# Patient Record
Sex: Female | Born: 1980 | Race: Black or African American | Hispanic: No | Marital: Single | Smoking: Never smoker
Health system: Southern US, Community
[De-identification: ages and names within clinical notes are randomized; demographics above are authoritative.]

## PROBLEM LIST (undated history)

## (undated) DIAGNOSIS — G43909 Migraine, unspecified, not intractable, without status migrainosus: Secondary | ICD-10-CM

## (undated) DIAGNOSIS — J069 Acute upper respiratory infection, unspecified: Secondary | ICD-10-CM

## (undated) DIAGNOSIS — J45909 Unspecified asthma, uncomplicated: Secondary | ICD-10-CM

## (undated) DIAGNOSIS — L509 Urticaria, unspecified: Secondary | ICD-10-CM

## (undated) DIAGNOSIS — L309 Dermatitis, unspecified: Secondary | ICD-10-CM

## (undated) HISTORY — DX: Acute upper respiratory infection, unspecified: J06.9

## (undated) HISTORY — DX: Migraine, unspecified, not intractable, without status migrainosus: G43.909

## (undated) HISTORY — DX: Urticaria, unspecified: L50.9

## (undated) HISTORY — PX: ADENOIDECTOMY: SUR15

## (undated) HISTORY — DX: Dermatitis, unspecified: L30.9

## (undated) HISTORY — PX: TONSILLECTOMY: SUR1361

## (undated) HISTORY — DX: Unspecified asthma, uncomplicated: J45.909

---

## 2010-07-21 HISTORY — PX: APPENDECTOMY: SHX54

## 2018-01-06 ENCOUNTER — Ambulatory Visit (INDEPENDENT_AMBULATORY_CARE_PROVIDER_SITE_OTHER): Payer: Managed Care, Other (non HMO) | Admitting: Allergy and Immunology

## 2018-01-06 ENCOUNTER — Encounter: Payer: Self-pay | Admitting: Allergy and Immunology

## 2018-01-06 VITALS — BP 128/88 | HR 93 | Temp 98.1°F | Resp 16 | Ht 63.27 in | Wt 229.0 lb

## 2018-01-06 DIAGNOSIS — J4530 Mild persistent asthma, uncomplicated: Secondary | ICD-10-CM | POA: Diagnosis not present

## 2018-01-06 DIAGNOSIS — J3089 Other allergic rhinitis: Secondary | ICD-10-CM

## 2018-01-06 DIAGNOSIS — H1013 Acute atopic conjunctivitis, bilateral: Secondary | ICD-10-CM

## 2018-01-06 DIAGNOSIS — H1010 Acute atopic conjunctivitis, unspecified eye: Secondary | ICD-10-CM | POA: Insufficient documentation

## 2018-01-06 DIAGNOSIS — J302 Other seasonal allergic rhinitis: Secondary | ICD-10-CM | POA: Insufficient documentation

## 2018-01-06 DIAGNOSIS — J45909 Unspecified asthma, uncomplicated: Secondary | ICD-10-CM

## 2018-01-06 HISTORY — DX: Unspecified asthma, uncomplicated: J45.909

## 2018-01-06 MED ORDER — ALBUTEROL SULFATE HFA 108 (90 BASE) MCG/ACT IN AERS
INHALATION_SPRAY | RESPIRATORY_TRACT | 2 refills | Status: DC
Start: 2018-01-06 — End: 2021-09-10

## 2018-01-06 MED ORDER — LEVOCETIRIZINE DIHYDROCHLORIDE 5 MG PO TABS: 5.0000 mg | ORAL_TABLET | Freq: Every evening | ORAL | 5 refills | Status: DC

## 2018-01-06 MED ORDER — EPINEPHRINE 0.3 MG/0.3ML IJ SOAJ: INTRAMUSCULAR | 3 refills | Status: DC

## 2018-01-06 MED ORDER — OLOPATADINE HCL 0.2 % OP SOLN: 1.0000 [drp] | Freq: Every day | OPHTHALMIC | 5 refills | Status: DC | PRN

## 2018-01-06 MED ORDER — FLUTICASONE PROPIONATE 93 MCG/ACT NA EXHU: 2.0000 "application " | INHALANT_SUSPENSION | Freq: Two times a day (BID) | NASAL | 5 refills | Status: DC

## 2018-01-06 NOTE — Progress Notes (Signed)
New Patient Note  RE: Melanie Rangel MRN: [id number] DOB: 1980/09/15 Date of Office Visit: 01/06/2018  Referring provider: No ref. provider found Primary care provider: Stephens November, MD  Chief Complaint: Sinus Problem and Breathing Problem   History of present illness: Melanie Rangel is a 37 y.o. female presenting today for evaluation of rhinosinusitis and bronchitis.  She complains of nasal congestion, thick postnasal drainage, and sinus pressure/pain between the eyes and over the forehead.  The symptoms occur year-round but are more frequent and severe in the springtime and in the fall.  The symptoms have progressed over the past 3 years since moving to [place].  She had previously lived in [place], [place], and [place].  She states that when she is not taking diphenhydramine or loratadine, she also experiences nasal pruritus, rhinorrhea, sneezing, and ocular pruritus.  She states that since January she has had to severe sinus infections, requiring prednisone and/or antibiotics, as well as associated bronchitis.  She was prescribed albuterol HFA earlier this year because she was experiencing wheezing, coughing, dyspnea, and chest tightness with the bronchitis.  She has not required albuterol rescue other than with respiratory tract infections.  Assessment and plan: Perennial and seasonal allergic rhinitis  Aeroallergen avoidance measures have been discussed and provided in written form.  A prescription has been provided for levocetirizine, 5 mg daily as needed.  A prescription has been provided for [facility], 2 actuations per nostril twice a day. Proper technique has been discussed and demonstrated.  Nasal saline lavage (NeilMed) has been recommended as needed and prior to medicated nasal sprays along with instructions for proper administration.  For thick post nasal drainage, nasal congestion, and/or sinus pressure, add guaifenesin 1200 mg (Mucinex Maximum  Strength) plus/minus pseudoephedrine 120 mg  twice daily as needed with adequate hydration as discussed. Pseudoephedrine is only to be used for short-term relief of nasal/sinus congestion. Long-term use is discouraged due to potential side effects.  The risks and benefits of aeroallergen immunotherapy have been discussed. The patient is motivated to initiate immunotherapy if insurance coverage is favorable. She will let us know how she would like to proceed.  Allergic conjunctivitis  Treatment plan as outlined above for allergic rhinitis.  A prescription has been provided for Pataday, one drop per eye daily as needed.  I have also recommended eye lubricant drops (i.e., Natural Tears) as needed.  Mild persistent asthma  Continue albuterol HFA, 1 to 2 inhalations every 6 hours if needed.  Subjective and objective measures of pulmonary function will be followed and the treatment plan will be adjusted accordingly.   Meds ordered this encounter  Medications  . EPINEPHrine (AUVI-Q) 0.3 mg/0.3 mL IJ SOAJ injection    Sig: Use as directed for severe allergic reactions    Dispense:  2 Device    Refill:  3  . levocetirizine (XYZAL) 5 MG tablet    Sig: Take 1 tablet (5 mg total) by mouth every evening.    Dispense:  30 tablet    Refill:  5  . Fluticasone Propionate (XHANCE) 93 MCG/ACT EXHU    Sig: Place 2 application into the nose 2 (two) times daily.    Dispense:  16 mL    Refill:  5  . Olopatadine HCl (PATADAY) 0.2 % SOLN    Sig: Apply 1 drop to eye daily as needed.    Dispense:  1 Bottle    Refill:  5  . albuterol (PROVENTIL HFA;VENTOLIN HFA) 108 (90 Base) MCG/ACT  inhaler    Sig: 1-2 puffs every 6 hours if needed for coughing or wheezing    Dispense:  1 Inhaler    Refill:  2    Diagnostics: Spirometry: FVC 2.40 L and FEV1 was 2.05 L (82% predicted) with significant (250 mL, 12%) postbronchodilator improvement.  This study was performed while the patient was asymptomatic.  Please  see scanned spirometry results for details. Epicutaneous testing: Positive to grass pollen, ragweed pollen, tree pollen and mold, and cat hair. Intradermal testing: Positive to weed pollen, molds dog epithelia, and dust mite antigen.    Physical examination: Blood pressure 128/88, pulse 93, temperature 98.1 F (36.7 C), temperature source Oral, resp. rate 16, height 5' 3.27" (1.607 m), weight 229 lb (103.9 kg), SpO2 97 %.  General: Alert, interactive, in no acute distress. HEENT: TMs pearly gray, turbinates moderately edematous with thick discharge, post-pharynx moderately erythematous. Neck: Supple without lymphadenopathy. Lungs: Clear to auscultation without wheezing, rhonchi or rales. CV: Normal S1, S2 without murmurs. Abdomen: Nondistended, nontender. Skin: Warm and dry, without lesions or rashes. Extremities:  No clubbing, cyanosis or edema. Neuro:   Grossly intact.  Review of systems:  Review of systems negative except as noted in HPI / PMHx or noted below: Review of Systems  Constitutional: Negative.   HENT: Negative.   Eyes: Negative.   Respiratory: Negative.   Cardiovascular: Negative.   Gastrointestinal: Negative.   Genitourinary: Negative.   Musculoskeletal: Negative.   Skin: Negative.   Neurological: Negative.   Endo/Heme/Allergies: Negative.   Psychiatric/Behavioral: Negative.     Past medical history:  Past Medical History:  Diagnosis Date  . Asthmatic bronchitis 01/06/2018  . Eczema    as a child  . Recurrent upper respiratory infection (URI)   . Urticaria    as a child    Past surgical history:  Past Surgical History:  Procedure Laterality Date  . ADENOIDECTOMY    . APPENDECTOMY  2012  . TONSILLECTOMY      Family history: Family History  Family history unknown: Yes    Social history: Social History   Socioeconomic History  . Marital status: Single    Spouse name: Not on file  . Number of children: Not on file  . Years of education: Not  on file  . Highest education level: Not on file  Occupational History  . Not on file  Social Needs  . Financial resource strain: Not on file  . Food insecurity:    Worry: Not on file    Inability: Not on file  . Transportation needs:    Medical: Not on file    Non-medical: Not on file  Tobacco Use  . Smoking status: Never Smoker  . Smokeless tobacco: Never Used  Substance and Sexual Activity  . Alcohol use: Not on file  . Drug use: Not on file  . Sexual activity: Not on file  Lifestyle  . Physical activity:    Days per week: Not on file    Minutes per session: Not on file  . Stress: Not on file  Relationships  . Social connections:    Talks on phone: Not on file    Gets together: Not on file    Attends religious service: Not on file    Active member of club or organization: Not on file    Attends meetings of clubs or organizations: Not on file    Relationship status: Not on file  . Intimate partner violence:    Fear of current  or ex partner: Not on file    Emotionally abused: Not on file    Physically abused: Not on file    Forced sexual activity: Not on file  Other Topics Concern  . Not on file  Social History Narrative  . Not on file   Environmental History: Patient lives in a 38 year old house with hardwood floors throughout, gas heat, and central air.  There is no known mold/water damage in the home.  There are no pets in the home.  She is a former cigarette smoker having smoked less than 1 pack/month from 1998-1999.  Allergies as of 01/06/2018      Reactions   Aspirin Swelling   Other reaction(s): Eye Swelling      Medication List        Accurate as of 01/06/18  1:42 PM. Always use your most recent med list.          albuterol 108 (90 Base) MCG/ACT inhaler Commonly known as:  PROVENTIL HFA;VENTOLIN HFA 1-2 puffs every 6 hours if needed for coughing or wheezing   diphenhydrAMINE 25 mg capsule Commonly known as:  BENADRYL Take 75 mg by mouth 2 (two)  times daily as needed.   EPINEPHrine 0.3 mg/0.3 mL Soaj injection Commonly known as:  AUVI-Q Use as directed for severe allergic reactions   fluticasone 50 MCG/ACT nasal spray Commonly known as:  FLONASE Place into the nose.   Fluticasone Propionate 93 MCG/ACT Exhu Commonly known as:  XHANCE Place 2 application into the nose 2 (two) times daily.   levocetirizine 5 MG tablet Commonly known as:  XYZAL Take 1 tablet (5 mg total) by mouth every evening.   loratadine 10 MG tablet Commonly known as:  CLARITIN Take 10 mg by mouth daily.   Olopatadine HCl 0.2 % Soln Commonly known as:  PATADAY Apply 1 drop to eye daily as needed.       Known medication allergies: Allergies  Allergen Reactions  . Aspirin Swelling    Other reaction(s): Eye Swelling    I appreciate the opportunity to take part in Dafne's care. Please do not hesitate to contact me with questions.  Sincerely,   R. Jorene Guest, MD

## 2018-01-06 NOTE — Assessment & Plan Note (Addendum)
   Aeroallergen avoidance measures have been discussed and provided in written form.  A prescription has been provided for levocetirizine, 5 mg daily as needed.  [facility], 2 actuations per nostril twice a day. Proper technique has been discussed and demonstrated.  Nasal saline lavage (NeilMed) has been recommended as needed and prior to medicated nasal sprays along with instructions for proper administration.  For thick post nasal drainage, nasal congestion, and/or sinus pressure, add guaifenesin 1200 mg (Mucinex Maximum Strength) plus/minus pseudoephedrine 120 mg  twice daily as needed with adequate hydration as discussed. Pseudoephedrine is only to be used for short-term relief of nasal/sinus congestion. Long-term use is discouraged due to potential side effects.  The risks and benefits of aeroallergen immunotherapy have been discussed. The patient is motivated to initiate immunotherapy if insurance coverage is favorable. She will let us know how she would like to proceed.

## 2018-01-06 NOTE — Assessment & Plan Note (Signed)
   Treatment plan as outlined above for allergic rhinitis.  A prescription has been provided for Pataday, one drop per eye daily as needed.  I have also recommended eye lubricant drops (i.e., Natural Tears) as needed. 

## 2018-01-06 NOTE — Patient Instructions (Addendum)
Perennial and seasonal allergic rhinitis  Aeroallergen avoidance measures have been discussed and provided in written form.  A prescription has been provided for levocetirizine, 5 mg daily as needed.  A prescription has been provided for [facility], 2 actuations per nostril twice a day. Proper technique has been discussed and demonstrated.  Nasal saline lavage (NeilMed) has been recommended as needed and prior to medicated nasal sprays along with instructions for proper administration.  For thick post nasal drainage, nasal congestion, and/or sinus pressure, add guaifenesin 1200 mg (Mucinex Maximum Strength) plus/minus pseudoephedrine 120 mg  twice daily as needed with adequate hydration as discussed. Pseudoephedrine is only to be used for short-term relief of nasal/sinus congestion. Long-term use is discouraged due to potential side effects.  The risks and benefits of aeroallergen immunotherapy have been discussed. The patient is motivated to initiate immunotherapy if insurance coverage is favorable. She will let us know how she would like to proceed.  Allergic conjunctivitis  Treatment plan as outlined above for allergic rhinitis.  A prescription has been provided for Pataday, one drop per eye daily as needed.  I have also recommended eye lubricant drops (i.e., Natural Tears) as needed.  Mild persistent asthma  Continue albuterol HFA, 1 to 2 inhalations every 6 hours if needed.  Subjective and objective measures of pulmonary function will be followed and the treatment plan will be adjusted accordingly.   Return in about 3 months (around 04/08/2018), or if symptoms worsen or fail to improve.  Reducing Pollen Exposure  The American Academy of Allergy, Asthma and Immunology suggests the following steps to reduce your exposure to pollen during allergy seasons.    1. Do not hang sheets or clothing out to dry; pollen may collect on these items. 2. Do not mow lawns or spend time around  freshly cut grass; mowing stirs up pollen. 3. Keep windows closed at night.  Keep car windows closed while driving. 4. Minimize morning activities outdoors, a time when pollen counts are usually at their highest. 5. Stay indoors as much as possible when pollen counts or humidity is high and on windy days when pollen tends to remain in the air longer. 6. Use air conditioning when possible.  Many air conditioners have filters that trap the pollen spores. 7. Use a HEPA room air filter to remove pollen form the indoor air you breathe.   Control of House Dust Mite Allergen  House dust mites play a major role in allergic asthma and rhinitis.  They occur in environments with high humidity wherever human skin, the food for dust mites is found. High levels have been detected in dust obtained from mattresses, pillows, carpets, upholstered furniture, bed covers, clothes and soft toys.  The principal allergen of the house dust mite is found in its feces.  A gram of dust may contain 1,000 mites and 250,000 fecal particles.  Mite antigen is easily measured in the air during house cleaning activities.    1. Encase mattresses, including the box spring, and pillow, in an air tight cover.  Seal the zipper end of the encased mattresses with wide adhesive tape. 2. Wash the bedding in water of 130 degrees Farenheit weekly.  Avoid cotton comforters/quilts and flannel bedding: the most ideal bed covering is the dacron comforter. 3. Remove all upholstered furniture from the bedroom. 4. Remove carpets, carpet padding, rugs, and non-washable window drapes from the bedroom.  Wash drapes weekly or use plastic window coverings. 5. Remove all non-washable stuffed toys from the bedroom.  Wash stuffed toys weekly. 6. Have the room cleaned frequently with a vacuum cleaner and a damp dust-mop.  The patient should not be in a room which is being cleaned and should wait 1 hour after cleaning before going into the room. 7. Close and  seal all heating outlets in the bedroom.  Otherwise, the room will become filled with dust-laden air.  An electric heater can be used to heat the room. Reduce indoor humidity to less than 50%.  Do not use a humidifier.  Control of Dog or Cat Allergen  Avoidance is the best way to manage a dog or cat allergy. If you have a dog or cat and are allergic to dog or cats, consider removing the dog or cat from the home. If you have a dog or cat but don't want to find it a new home, or if your family wants a pet even though someone in the household is allergic, here are some strategies that may help keep symptoms at bay:  1. Keep the pet out of your bedroom and restrict it to only a few rooms. Be advised that keeping the dog or cat in only one room will not limit the allergens to that room. 2. Don't pet, hug or kiss the dog or cat; if you do, wash your hands with soap and water. 3. High-efficiency particulate air (HEPA) cleaners run continuously in a bedroom or living room can reduce allergen levels over time. 4. Place electrostatic material sheet in the air inlet vent in the bedroom. 5. Regular use of a high-efficiency vacuum cleaner or a central vacuum can reduce allergen levels. 6. Giving your dog or cat a bath at least once a week can reduce airborne allergen.  Control of Mold Allergen  Mold and fungi can grow on a variety of surfaces provided certain temperature and moisture conditions exist.  Outdoor molds grow on plants, decaying vegetation and soil.  The major outdoor mold, Alternaria and Cladosporium, are found in very high numbers during hot and dry conditions.  Generally, a late Summer - Fall peak is seen for common outdoor fungal spores.  Rain will temporarily lower outdoor mold spore count, but counts rise rapidly when the rainy period ends.  The most important indoor molds are Aspergillus and Penicillium.  Dark, humid and poorly ventilated basements are ideal sites for mold growth.  The next  most common sites of mold growth are the bathroom and the kitchen.  Outdoor [employer] 1. Use air conditioning and keep windows closed 2. Avoid exposure to decaying vegetation. 3. Avoid leaf raking. 4. Avoid grain handling. 5. Consider wearing a face mask if working in moldy areas.  Indoor Mold Control 1. Maintain humidity below 50%. 2. Clean washable surfaces with 5% bleach solution. 3. Remove sources e.g. Contaminated carpets.

## 2018-01-06 NOTE — Assessment & Plan Note (Signed)
   Continue albuterol HFA, 1 to 2 inhalations every 6 hours if needed.  Subjective and objective measures of pulmonary function will be followed and the treatment plan will be adjusted accordingly. 

## 2018-01-07 DIAGNOSIS — J301 Allergic rhinitis due to pollen: Secondary | ICD-10-CM | POA: Diagnosis not present

## 2018-01-07 NOTE — Progress Notes (Signed)
VIALS EXP 01-08-19

## 2018-01-08 DIAGNOSIS — J3089 Other allergic rhinitis: Secondary | ICD-10-CM | POA: Diagnosis not present

## 2018-01-25 ENCOUNTER — Ambulatory Visit: Payer: Self-pay

## 2018-02-01 ENCOUNTER — Ambulatory Visit (INDEPENDENT_AMBULATORY_CARE_PROVIDER_SITE_OTHER): Payer: Managed Care, Other (non HMO) | Admitting: *Deleted

## 2018-02-01 DIAGNOSIS — J309 Allergic rhinitis, unspecified: Secondary | ICD-10-CM | POA: Diagnosis not present

## 2018-02-01 NOTE — Progress Notes (Signed)
Immunotherapy   Patient Details  Name: Melanie Rangel MRN: [id number] Date of Birth: [birth year]  02/01/2018  Melanie Rangel started injections for  Grass-Weed-Tree & Mold-Dmite-Cat-Dog. Following schedule: A  Frequency:2 times per week with 72 hours in between.  Epi-Pen:Epi-Pen Available  Consent signed and patient instructions given. No problems after 30 minutes in the office.   Mariane DuvalHeather L Vernon 02/01/2018, 3:10 PM

## 2018-02-10 ENCOUNTER — Ambulatory Visit (INDEPENDENT_AMBULATORY_CARE_PROVIDER_SITE_OTHER): Payer: Managed Care, Other (non HMO)

## 2018-02-10 DIAGNOSIS — J309 Allergic rhinitis, unspecified: Secondary | ICD-10-CM

## 2018-02-16 ENCOUNTER — Ambulatory Visit (INDEPENDENT_AMBULATORY_CARE_PROVIDER_SITE_OTHER): Payer: Managed Care, Other (non HMO)

## 2018-02-16 DIAGNOSIS — J309 Allergic rhinitis, unspecified: Secondary | ICD-10-CM | POA: Diagnosis not present

## 2018-02-24 ENCOUNTER — Ambulatory Visit (INDEPENDENT_AMBULATORY_CARE_PROVIDER_SITE_OTHER): Payer: Managed Care, Other (non HMO)

## 2018-02-24 DIAGNOSIS — J309 Allergic rhinitis, unspecified: Secondary | ICD-10-CM | POA: Diagnosis not present

## 2018-03-02 ENCOUNTER — Ambulatory Visit (INDEPENDENT_AMBULATORY_CARE_PROVIDER_SITE_OTHER): Payer: Managed Care, Other (non HMO)

## 2018-03-02 DIAGNOSIS — J309 Allergic rhinitis, unspecified: Secondary | ICD-10-CM

## 2018-03-11 ENCOUNTER — Ambulatory Visit (INDEPENDENT_AMBULATORY_CARE_PROVIDER_SITE_OTHER): Payer: Managed Care, Other (non HMO) | Admitting: *Deleted

## 2018-03-11 DIAGNOSIS — J309 Allergic rhinitis, unspecified: Secondary | ICD-10-CM | POA: Diagnosis not present

## 2018-03-17 ENCOUNTER — Other Ambulatory Visit: Payer: Self-pay | Admitting: *Deleted

## 2018-03-17 ENCOUNTER — Ambulatory Visit (INDEPENDENT_AMBULATORY_CARE_PROVIDER_SITE_OTHER): Payer: Managed Care, Other (non HMO) | Admitting: *Deleted

## 2018-03-17 DIAGNOSIS — J309 Allergic rhinitis, unspecified: Secondary | ICD-10-CM | POA: Diagnosis not present

## 2018-03-17 MED ORDER — FLUTICASONE PROPIONATE 93 MCG/ACT NA EXHU: 2.0000 "application " | INHALANT_SUSPENSION | Freq: Two times a day (BID) | NASAL | 5 refills | Status: DC

## 2018-03-23 ENCOUNTER — Ambulatory Visit (INDEPENDENT_AMBULATORY_CARE_PROVIDER_SITE_OTHER): Payer: Managed Care, Other (non HMO)

## 2018-03-23 DIAGNOSIS — J309 Allergic rhinitis, unspecified: Secondary | ICD-10-CM | POA: Diagnosis not present

## 2018-03-30 ENCOUNTER — Ambulatory Visit (INDEPENDENT_AMBULATORY_CARE_PROVIDER_SITE_OTHER): Payer: Managed Care, Other (non HMO)

## 2018-03-30 DIAGNOSIS — J309 Allergic rhinitis, unspecified: Secondary | ICD-10-CM

## 2018-04-07 ENCOUNTER — Ambulatory Visit (INDEPENDENT_AMBULATORY_CARE_PROVIDER_SITE_OTHER): Payer: Managed Care, Other (non HMO)

## 2018-04-07 DIAGNOSIS — J309 Allergic rhinitis, unspecified: Secondary | ICD-10-CM

## 2018-04-12 ENCOUNTER — Other Ambulatory Visit: Payer: Self-pay | Admitting: Allergy

## 2018-04-12 ENCOUNTER — Telehealth: Payer: Self-pay | Admitting: Allergy

## 2018-04-12 NOTE — Telephone Encounter (Signed)
See if her insurance will pay for mometasone nasal spray to try that one.  Thank you.

## 2018-04-12 NOTE — Telephone Encounter (Signed)
Dr. Nunzio CobbsBobbitt Insurance will not pay for [facility]. She needs to try budesonide nasal ,fluisolide nasal,mometasone nasal or triamcinolone nasal. Pt. Has tried fluticasone.. Please advise. Thank you.

## 2018-04-13 ENCOUNTER — Ambulatory Visit (INDEPENDENT_AMBULATORY_CARE_PROVIDER_SITE_OTHER): Payer: Managed Care, Other (non HMO)

## 2018-04-13 DIAGNOSIS — J309 Allergic rhinitis, unspecified: Secondary | ICD-10-CM

## 2018-04-13 NOTE — Telephone Encounter (Signed)
Dr. Nunzio CobbsBobbitt How many sprays per nostrils a day.

## 2018-04-14 ENCOUNTER — Other Ambulatory Visit: Payer: Self-pay | Admitting: Allergy

## 2018-04-14 MED ORDER — MOMETASONE FUROATE 50 MCG/ACT NA SUSP: 2.0000 | Freq: Every day | NASAL | 5 refills | Status: DC

## 2018-04-14 NOTE — Telephone Encounter (Signed)
Called in [facility] nasal spray Two sprays once a day as needed. Left Patient a message

## 2018-04-19 ENCOUNTER — Ambulatory Visit (INDEPENDENT_AMBULATORY_CARE_PROVIDER_SITE_OTHER): Payer: Managed Care, Other (non HMO) | Admitting: *Deleted

## 2018-04-19 DIAGNOSIS — J309 Allergic rhinitis, unspecified: Secondary | ICD-10-CM | POA: Diagnosis not present

## 2018-04-28 ENCOUNTER — Ambulatory Visit (INDEPENDENT_AMBULATORY_CARE_PROVIDER_SITE_OTHER): Payer: Managed Care, Other (non HMO)

## 2018-04-28 DIAGNOSIS — J309 Allergic rhinitis, unspecified: Secondary | ICD-10-CM | POA: Diagnosis not present

## 2018-05-06 ENCOUNTER — Ambulatory Visit (INDEPENDENT_AMBULATORY_CARE_PROVIDER_SITE_OTHER): Payer: Managed Care, Other (non HMO)

## 2018-05-06 DIAGNOSIS — J309 Allergic rhinitis, unspecified: Secondary | ICD-10-CM | POA: Diagnosis not present

## 2018-05-14 ENCOUNTER — Ambulatory Visit (INDEPENDENT_AMBULATORY_CARE_PROVIDER_SITE_OTHER): Payer: Managed Care, Other (non HMO)

## 2018-05-14 DIAGNOSIS — J309 Allergic rhinitis, unspecified: Secondary | ICD-10-CM

## 2018-05-21 ENCOUNTER — Ambulatory Visit (INDEPENDENT_AMBULATORY_CARE_PROVIDER_SITE_OTHER): Payer: Managed Care, Other (non HMO)

## 2018-05-21 DIAGNOSIS — J309 Allergic rhinitis, unspecified: Secondary | ICD-10-CM | POA: Diagnosis not present

## 2018-05-27 ENCOUNTER — Ambulatory Visit (INDEPENDENT_AMBULATORY_CARE_PROVIDER_SITE_OTHER): Payer: Managed Care, Other (non HMO)

## 2018-05-27 DIAGNOSIS — J309 Allergic rhinitis, unspecified: Secondary | ICD-10-CM | POA: Diagnosis not present

## 2018-06-02 ENCOUNTER — Ambulatory Visit (INDEPENDENT_AMBULATORY_CARE_PROVIDER_SITE_OTHER): Payer: Managed Care, Other (non HMO) | Admitting: *Deleted

## 2018-06-02 DIAGNOSIS — J309 Allergic rhinitis, unspecified: Secondary | ICD-10-CM | POA: Diagnosis not present

## 2018-06-09 ENCOUNTER — Ambulatory Visit (INDEPENDENT_AMBULATORY_CARE_PROVIDER_SITE_OTHER): Payer: Managed Care, Other (non HMO)

## 2018-06-09 DIAGNOSIS — J309 Allergic rhinitis, unspecified: Secondary | ICD-10-CM | POA: Diagnosis not present

## 2018-06-16 ENCOUNTER — Ambulatory Visit (INDEPENDENT_AMBULATORY_CARE_PROVIDER_SITE_OTHER): Payer: Managed Care, Other (non HMO)

## 2018-06-16 DIAGNOSIS — J309 Allergic rhinitis, unspecified: Secondary | ICD-10-CM | POA: Diagnosis not present

## 2018-06-24 ENCOUNTER — Ambulatory Visit (INDEPENDENT_AMBULATORY_CARE_PROVIDER_SITE_OTHER): Payer: Managed Care, Other (non HMO)

## 2018-06-24 DIAGNOSIS — J309 Allergic rhinitis, unspecified: Secondary | ICD-10-CM

## 2018-07-01 ENCOUNTER — Ambulatory Visit (INDEPENDENT_AMBULATORY_CARE_PROVIDER_SITE_OTHER): Payer: Managed Care, Other (non HMO) | Admitting: *Deleted

## 2018-07-01 DIAGNOSIS — J309 Allergic rhinitis, unspecified: Secondary | ICD-10-CM | POA: Diagnosis not present

## 2018-07-07 ENCOUNTER — Ambulatory Visit (INDEPENDENT_AMBULATORY_CARE_PROVIDER_SITE_OTHER): Payer: Managed Care, Other (non HMO)

## 2018-07-07 DIAGNOSIS — J309 Allergic rhinitis, unspecified: Secondary | ICD-10-CM

## 2018-07-12 ENCOUNTER — Ambulatory Visit (INDEPENDENT_AMBULATORY_CARE_PROVIDER_SITE_OTHER): Payer: Managed Care, Other (non HMO)

## 2018-07-12 DIAGNOSIS — J309 Allergic rhinitis, unspecified: Secondary | ICD-10-CM

## 2018-07-29 ENCOUNTER — Ambulatory Visit (INDEPENDENT_AMBULATORY_CARE_PROVIDER_SITE_OTHER): Payer: Managed Care, Other (non HMO)

## 2018-07-29 DIAGNOSIS — J309 Allergic rhinitis, unspecified: Secondary | ICD-10-CM | POA: Diagnosis not present

## 2018-08-04 ENCOUNTER — Ambulatory Visit (INDEPENDENT_AMBULATORY_CARE_PROVIDER_SITE_OTHER): Payer: Managed Care, Other (non HMO)

## 2018-08-04 DIAGNOSIS — J309 Allergic rhinitis, unspecified: Secondary | ICD-10-CM

## 2018-08-12 ENCOUNTER — Ambulatory Visit (INDEPENDENT_AMBULATORY_CARE_PROVIDER_SITE_OTHER): Payer: Managed Care, Other (non HMO) | Admitting: *Deleted

## 2018-08-12 DIAGNOSIS — J309 Allergic rhinitis, unspecified: Secondary | ICD-10-CM

## 2018-08-30 ENCOUNTER — Ambulatory Visit: Payer: Managed Care, Other (non HMO) | Admitting: Family Medicine

## 2018-08-30 ENCOUNTER — Encounter: Payer: Self-pay | Admitting: Family Medicine

## 2018-08-30 VITALS — BP 128/80 | HR 107 | Temp 98.4°F | Resp 18 | Ht 63.5 in | Wt 233.2 lb

## 2018-08-30 DIAGNOSIS — L253 Unspecified contact dermatitis due to other chemical products: Secondary | ICD-10-CM | POA: Diagnosis not present

## 2018-08-30 DIAGNOSIS — H1013 Acute atopic conjunctivitis, bilateral: Secondary | ICD-10-CM | POA: Insufficient documentation

## 2018-08-30 DIAGNOSIS — J3089 Other allergic rhinitis: Secondary | ICD-10-CM

## 2018-08-30 DIAGNOSIS — J4530 Mild persistent asthma, uncomplicated: Secondary | ICD-10-CM | POA: Diagnosis not present

## 2018-08-30 DIAGNOSIS — L282 Other prurigo: Secondary | ICD-10-CM | POA: Insufficient documentation

## 2018-08-30 MED ORDER — MONTELUKAST SODIUM 10 MG PO TABS
10.0000 mg | ORAL_TABLET | Freq: Every day | ORAL | 5 refills | Status: DC
Start: 2018-08-30 — End: 2020-12-04

## 2018-08-30 MED ORDER — CLOTRIMAZOLE 1 % EX CREA: TOPICAL_CREAM | CUTANEOUS | 0 refills | Status: AC

## 2018-08-30 MED ORDER — TRIAMCINOLONE ACETONIDE 0.1 % EX CREA: TOPICAL_CREAM | CUTANEOUS | 3 refills | Status: DC

## 2018-08-30 MED ORDER — FLUTICASONE PROPIONATE 50 MCG/ACT NA SUSP: 2.0000 | Freq: Two times a day (BID) | NASAL | 5 refills | Status: DC

## 2018-08-30 NOTE — Patient Instructions (Addendum)
Perennial and seasonal allergic rhinitis Begin Mucinex [phone] mg twice a day Begin nasal saline rinses once a day. Use before any medicated sprays Continue levocetirizine 5 mg once a day as needed for a stuffy nose Begin montelukast 10 mg once a day. This may help with nasal symptoms Further evaluation by ENT may be necessary if symptoms worsen or do not improve Continue allergy injections in 3 days, or when you feel better  Allergic conjunctivitis of both eyes Continue P[place]o one drop in each eye once a day as needed for red or itchy eyes  Mild persistent asthma Continue ProAir 2 puffs every 4 hours as needed for cough or wheeze  Contact dermatitis Begin triamcinolone cream 0.1% to red, itchy areas below your face. Stop using deoderant for 1 week. After that, begin [employer] Lot[employer] 1% twice a day for the next 2 weeks If your symptoms reccur, begin a journal of events that occurred for up to 6 hours before your symptoms began including foods and beverages consumed, soaps or perfumes you had contact with, and medications.  Prednisone 10 mg tablet. Take 2 tablets twice a day for the next 3 days, then take 2 tablets for 1 day, then take 1 tablet on the 5th day, then stop  Continue the medications listed in the chart  Call us if this treatment plan is not working well for you  Follow up in 2 weeks or sooner if needed

## 2018-08-30 NOTE — Progress Notes (Signed)
[address] [place] [id number] Dept: [phone]  FOLLOW UP NOTE  Patient ID: Melanie Rangel, female    DOB: 1981/07/14  Age: 38 y.o. MRN: [id number] Date of Office Visit: 08/30/2018  Assessment  Chief Complaint: Allergies (Rash, runny nose, and some blood in nose. )  HPI Melanie Peacockrica Tempesta is a 38 year old female who presents to the clinic for an acute visit. She reports a rash that appeared in the axillae area about 2 weeks ago and is described as bumps that are extremely itchy.  She denies new deodorant, soap, fragrances, medications, and foods.  She currently uses an over-the-counter antihistamine once a day.  Allergic rhinitis is reported as not well controlled with increased mucus and occasional blood streaked mucus secretions that began about 2 weeks ago.  She reports that her insurance did not cover Xhance and she has been using Flonase once a day with poor technique.  She is not currently using a nasal saline rinse.  She reports her eyes have been slightly itchy but not enough to require eyedrops.  Her current medications are listed in the chart.   Drug Allergies:  Allergies  Allergen Reactions  . Aspirin Swelling    Other reaction(s): Eye Swelling    Physical Exam: BP 128/80   Pulse (!) 107   Temp 98.4 F (36.9 C) (Oral)   Resp 18   Ht 5' 3.5" (1.613 m)   Wt 233 lb 3.7 oz (105.8 kg)   SpO2 98%   BMI 40.67 kg/m    Physical Exam Vitals signs reviewed.  Constitutional:      Appearance: Normal appearance.  HENT:     Head: Normocephalic and atraumatic.     Right Ear: Tympanic membrane normal.     Left Ear: Tympanic membrane normal.     Nose:     Comments: Bilateral nares edematous and pale with clear nasal drainage noted.  Pharynx normal.  Ears normal.  Eyes normal.    Mouth/Throat:     Pharynx: Oropharynx is clear.  Eyes:     Conjunctiva/sclera: Conjunctivae normal.  Neck:     Musculoskeletal: Normal range of motion and neck supple.  Cardiovascular:   Rate and Rhythm: Normal rate and regular rhythm.     Heart sounds: No murmur.  Pulmonary:     Effort: Pulmonary effort is normal.     Breath sounds: Normal breath sounds.     Comments: Lungs clear to auscultation Musculoskeletal: Normal range of motion.  Skin:    General: Skin is warm.     Comments: Bilateral axilla with dark papular linear area.  No open areas and no drainage noted  Neurological:     Mental Status: She is alert and oriented to person, place, and time.  Psychiatric:        Mood and Affect: Mood normal.        Behavior: Behavior normal.        Thought Content: Thought content normal.        Judgment: Judgment normal.     Diagnostics: FVC 2.70, FEV1 2.08.  Predicted FVC 3.12, predicted FEV1 2.58.  Spirometry is within the normal range.  Assessment and Plan: 1. Perennial and seasonal allergic rhinitis   2. Allergic conjunctivitis of both eyes   3. Mild persistent asthma without complication   4. Contact dermatitis due to chemicals     Meds ordered this encounter  Medications  . fluticasone (FLONASE) 50 MCG/ACT nasal spray    Sig: Place 2 sprays  into both nostrils 2 (two) times daily.    Dispense:  18.2 g    Refill:  5  . montelukast (SINGULAIR) 10 MG tablet    Sig: Take 1 tablet (10 mg total) by mouth at bedtime.    Dispense:  30 tablet    Refill:  5  . triamcinolone cream (KENALOG) 0.1 %    Sig: Apply to red itchy areas below the face    Dispense:  45 g    Refill:  3  . clotrimazole (LOTRIMIN AF) 1 % cream    Sig: Apply topical for 2 weeks    Dispense:  30 g    Refill:  0    Patient Instructions  Perennial and seasonal allergic rhinitis Begin Mucinex [phone] mg twice a day Begin nasal saline rinses once a day. Use before any medicated sprays Continue levocetirizine 5 mg once a day as needed for a stuffy nose Begin montelukast 10 mg once a day. This may help with nasal symptoms Further evaluation by ENT may be necessary if symptoms worsen or do  not improve Continue allergy injections in 3 days, or when you feel better  Allergic conjunctivitis of both eyes Continue Pazeo one drop in each eye once a day as needed for red or itchy eyes  Mild persistent asthma Continue ProAir 2 puffs every 4 hours as needed for cough or wheeze  Contact dermatitis Begin triamcinolone cream 0.1% to red, itchy areas below your face. Stop using deoderant for 1 week. After that, begin [employer] Lotrimin 1% twice a day for the next 2 weeks If your symptoms reccur, begin a journal of events that occurred for up to 6 hours before your symptoms began including foods and beverages consumed, soaps or perfumes you had contact with, and medications.  Prednisone 10 mg tablet. Take 2 tablets twice a day for the next 3 days, then take 2 tablets for 1 day, then take 1 tablet on the 5th day, then stop  Continue the medications listed in the chart  Call us if this treatment plan is not working well for you  Follow up in 2 weeks or sooner if needed   Return in about 2 weeks (around 09/13/2018), or if symptoms worsen or fail to improve.   Thank you for the opportunity to care for this patient.  Please do not hesitate to contact me with questions.  Melanie Leyland, FNP [facility]  I have provided oversight concerning Melanie Rangel' evaluation and treatment of this patient's health issues addressed during today's encounter. I agree with the assessment and therapeutic plan as outlined in the note.   Thank you for the opportunity to care for this patient.  Please do not hesitate to contact me with questions.  Tonette Bihari, M.D.  [facility] [address] [place], [place] [address] [phone]

## 2018-08-31 NOTE — Addendum Note (Signed)
Addended by: Virl Son D on: 08/31/2018 01:45 PM   Modules accepted: Orders

## 2018-09-03 ENCOUNTER — Telehealth: Payer: Self-pay

## 2018-09-03 NOTE — Telephone Encounter (Signed)
Patient would like a note stating that she cannot wear any deodorant for one week due to rash under arms. Call patient when ready at [phone]

## 2018-09-03 NOTE — Telephone Encounter (Signed)
Spoke with pt. To let her know that her letter was ready for pick up. Pt. Is aware that the office is closed for lunch 12:30  to1:30 everyday. Pt. Will pick up letter in the [place] office.

## 2018-09-03 NOTE — Telephone Encounter (Signed)
Can you please let her know the letter is ready. There is a copy in her chart or a printed copy is available at the [place] office. Thank you

## 2018-09-03 NOTE — Telephone Encounter (Signed)
Letter written

## 2018-09-08 ENCOUNTER — Ambulatory Visit (INDEPENDENT_AMBULATORY_CARE_PROVIDER_SITE_OTHER): Payer: Managed Care, Other (non HMO) | Admitting: *Deleted

## 2018-09-08 DIAGNOSIS — J309 Allergic rhinitis, unspecified: Secondary | ICD-10-CM | POA: Diagnosis not present

## 2018-09-13 ENCOUNTER — Ambulatory Visit: Payer: Managed Care, Other (non HMO) | Admitting: Family Medicine

## 2018-09-14 ENCOUNTER — Ambulatory Visit (INDEPENDENT_AMBULATORY_CARE_PROVIDER_SITE_OTHER): Payer: Managed Care, Other (non HMO)

## 2018-09-14 DIAGNOSIS — J309 Allergic rhinitis, unspecified: Secondary | ICD-10-CM

## 2018-09-22 ENCOUNTER — Ambulatory Visit (INDEPENDENT_AMBULATORY_CARE_PROVIDER_SITE_OTHER): Payer: Managed Care, Other (non HMO)

## 2018-09-22 DIAGNOSIS — J309 Allergic rhinitis, unspecified: Secondary | ICD-10-CM | POA: Diagnosis not present

## 2018-09-27 ENCOUNTER — Ambulatory Visit (INDEPENDENT_AMBULATORY_CARE_PROVIDER_SITE_OTHER): Payer: Managed Care, Other (non HMO)

## 2018-09-27 DIAGNOSIS — J309 Allergic rhinitis, unspecified: Secondary | ICD-10-CM

## 2018-10-12 ENCOUNTER — Ambulatory Visit (INDEPENDENT_AMBULATORY_CARE_PROVIDER_SITE_OTHER): Payer: Managed Care, Other (non HMO)

## 2018-10-12 DIAGNOSIS — J309 Allergic rhinitis, unspecified: Secondary | ICD-10-CM

## 2018-10-20 ENCOUNTER — Ambulatory Visit (INDEPENDENT_AMBULATORY_CARE_PROVIDER_SITE_OTHER): Payer: Managed Care, Other (non HMO)

## 2018-10-20 DIAGNOSIS — J309 Allergic rhinitis, unspecified: Secondary | ICD-10-CM | POA: Diagnosis not present

## 2018-10-27 ENCOUNTER — Ambulatory Visit (INDEPENDENT_AMBULATORY_CARE_PROVIDER_SITE_OTHER): Payer: Managed Care, Other (non HMO)

## 2018-10-27 DIAGNOSIS — J309 Allergic rhinitis, unspecified: Secondary | ICD-10-CM

## 2018-11-01 ENCOUNTER — Ambulatory Visit (INDEPENDENT_AMBULATORY_CARE_PROVIDER_SITE_OTHER): Payer: Managed Care, Other (non HMO)

## 2018-11-01 DIAGNOSIS — J309 Allergic rhinitis, unspecified: Secondary | ICD-10-CM | POA: Diagnosis not present

## 2018-11-10 ENCOUNTER — Ambulatory Visit (INDEPENDENT_AMBULATORY_CARE_PROVIDER_SITE_OTHER): Payer: Managed Care, Other (non HMO)

## 2018-11-10 DIAGNOSIS — J309 Allergic rhinitis, unspecified: Secondary | ICD-10-CM | POA: Diagnosis not present

## 2018-11-17 ENCOUNTER — Ambulatory Visit (INDEPENDENT_AMBULATORY_CARE_PROVIDER_SITE_OTHER): Payer: Managed Care, Other (non HMO)

## 2018-11-17 DIAGNOSIS — J309 Allergic rhinitis, unspecified: Secondary | ICD-10-CM | POA: Diagnosis not present

## 2018-11-24 ENCOUNTER — Ambulatory Visit (INDEPENDENT_AMBULATORY_CARE_PROVIDER_SITE_OTHER): Payer: Managed Care, Other (non HMO)

## 2018-11-24 DIAGNOSIS — J309 Allergic rhinitis, unspecified: Secondary | ICD-10-CM

## 2018-12-01 ENCOUNTER — Ambulatory Visit (INDEPENDENT_AMBULATORY_CARE_PROVIDER_SITE_OTHER): Payer: Managed Care, Other (non HMO)

## 2018-12-01 DIAGNOSIS — J309 Allergic rhinitis, unspecified: Secondary | ICD-10-CM

## 2018-12-08 ENCOUNTER — Ambulatory Visit (INDEPENDENT_AMBULATORY_CARE_PROVIDER_SITE_OTHER): Payer: Managed Care, Other (non HMO)

## 2018-12-08 DIAGNOSIS — J309 Allergic rhinitis, unspecified: Secondary | ICD-10-CM | POA: Diagnosis not present

## 2018-12-14 ENCOUNTER — Ambulatory Visit (INDEPENDENT_AMBULATORY_CARE_PROVIDER_SITE_OTHER): Payer: Managed Care, Other (non HMO)

## 2018-12-14 DIAGNOSIS — J309 Allergic rhinitis, unspecified: Secondary | ICD-10-CM | POA: Diagnosis not present

## 2018-12-22 ENCOUNTER — Ambulatory Visit (INDEPENDENT_AMBULATORY_CARE_PROVIDER_SITE_OTHER): Payer: Managed Care, Other (non HMO)

## 2018-12-22 DIAGNOSIS — J309 Allergic rhinitis, unspecified: Secondary | ICD-10-CM

## 2018-12-29 ENCOUNTER — Ambulatory Visit (INDEPENDENT_AMBULATORY_CARE_PROVIDER_SITE_OTHER): Payer: Managed Care, Other (non HMO)

## 2018-12-29 DIAGNOSIS — J309 Allergic rhinitis, unspecified: Secondary | ICD-10-CM

## 2019-01-03 ENCOUNTER — Ambulatory Visit (INDEPENDENT_AMBULATORY_CARE_PROVIDER_SITE_OTHER): Payer: Managed Care, Other (non HMO)

## 2019-01-03 DIAGNOSIS — J309 Allergic rhinitis, unspecified: Secondary | ICD-10-CM

## 2020-05-28 ENCOUNTER — Other Ambulatory Visit: Payer: Self-pay

## 2020-05-28 DIAGNOSIS — R2233 Localized swelling, mass and lump, upper limb, bilateral: Secondary | ICD-10-CM

## 2020-06-12 ENCOUNTER — Other Ambulatory Visit: Payer: Self-pay | Admitting: Obstetrics and Gynecology

## 2020-06-12 ENCOUNTER — Ambulatory Visit
Admission: RE | Admit: 2020-06-12 | Discharge: 2020-06-12 | Disposition: A | Discharge status: Home / Self Care | Payer: No Typology Code available for payment source | Source: Ambulatory Visit | Attending: Obstetrics and Gynecology | Admitting: Obstetrics and Gynecology

## 2020-06-12 ENCOUNTER — Ambulatory Visit
Admission: RE | Admit: 2020-06-12 | Discharge: 2020-06-12 | Disposition: A | Discharge status: Home / Self Care | Payer: Managed Care, Other (non HMO) | Source: Ambulatory Visit | Attending: Obstetrics and Gynecology | Admitting: Obstetrics and Gynecology

## 2020-06-12 ENCOUNTER — Other Ambulatory Visit: Payer: Self-pay

## 2020-06-12 ENCOUNTER — Ambulatory Visit: Payer: Self-pay | Admitting: *Deleted

## 2020-06-12 VITALS — BP 118/82 | Wt 237.4 lb

## 2020-06-12 DIAGNOSIS — R2232 Localized swelling, mass and lump, left upper limb: Secondary | ICD-10-CM

## 2020-06-12 DIAGNOSIS — R2233 Localized swelling, mass and lump, upper limb, bilateral: Secondary | ICD-10-CM

## 2020-06-12 DIAGNOSIS — N6320 Unspecified lump in the left breast, unspecified quadrant: Secondary | ICD-10-CM

## 2020-06-12 DIAGNOSIS — Z1239 Encounter for other screening for malignant neoplasm of breast: Secondary | ICD-10-CM

## 2020-06-12 NOTE — Patient Instructions (Signed)
Explained breast self awareness with Daun Peacock. Patient did not need a Pap smear today due to last Pap smear and HPV typing was 02/29/2020. Let her know [facility] will cover Pap smears and HPV typing every 5 years unless has a history of abnormal Pap smears. Referred patient to the [facility] for a diagnostic mammogram. Appointment scheduled Tuesday, June 12, 2020 at 1010. Patient aware of appointment and will be there. Daun Peacock verbalized understanding.  Sharlee Rufino, Kathaleen Maser, RN 9:28 AM

## 2020-06-12 NOTE — Progress Notes (Addendum)
Melanie Rangel is a 39 y.o. female who presents to [facility] clinic today with complaint of bilateral axillary lumps and pain x 5 months. Patient states its a throbbing pain that comes and goes. Patient rates the pain at a 3 out of 10.   Pap Smear: Pap smear not completed today. Last Pap smear was 02/29/2020 at [facility] and was normal with negative HPV. Per patient has history of an abnormal Pap smear 10 years ago that a colposcopy was completed for follow-up. Per patient has had at least three normal Pap smears since colposcopy. Last Pap smear result is available in Epic.   Physical exam: Breasts Breasts symmetrical. No skin abnormalities bilateral breasts. No nipple retraction bilateral breasts. No nipple discharge bilateral breasts. No lymphadenopathy. No lumps palpated right breast. Unable to palpate a lump in patients area of concern within the right axilla. Palpated a pea sized lump within the left axilla at 2 o'clock 20 cm from the nipple. Patient complained of tenderness when palpated bilateral axilla on exam.    Pelvic/Bimanual Pap is not indicated today per [facility] guidelines.   Smoking History: Patient stated she smoked for 2 months 22-23 years ago and quit.   Patient Navigation: Patient education provided. Access to services provided for patient through [facility] program.    Breast and Cervical Cancer Risk Assessment: Patient does not have family history of breast cancer, known genetic mutations, or radiation treatment to the chest before age 33. Per patient has history of cervical dysplasia. Patient has no history of being immunocompromised or DES exposure in-utero.  Risk Assessment    Risk Scores      06/12/2020   Last edited by: Meryl Dare, CMA   5-year risk: 0.4 %   Lifetime risk: 7.5 %          A: [facility] exam without pap smear Complaint of bilateral axillary lumps and pain.  P: Referred patient to the [facility] for a  diagnostic mammogram. Appointment scheduled Tuesday, June 12, 2020 at 1010.  Priscille Heidelberg, RN 06/12/2020 9:28 AM

## 2020-06-22 ENCOUNTER — Other Ambulatory Visit: Payer: Self-pay

## 2020-06-22 ENCOUNTER — Ambulatory Visit
Admission: RE | Admit: 2020-06-22 | Discharge: 2020-06-22 | Disposition: A | Discharge status: Home / Self Care | Payer: No Typology Code available for payment source | Source: Ambulatory Visit | Attending: Obstetrics and Gynecology | Admitting: Obstetrics and Gynecology

## 2020-06-22 DIAGNOSIS — N6320 Unspecified lump in the left breast, unspecified quadrant: Secondary | ICD-10-CM

## 2020-06-22 HISTORY — PX: BREAST BIOPSY: SHX20

## 2020-06-29 ENCOUNTER — Other Ambulatory Visit: Payer: Self-pay | Admitting: Obstetrics and Gynecology

## 2020-06-29 DIAGNOSIS — N63 Unspecified lump in unspecified breast: Secondary | ICD-10-CM

## 2020-07-02 ENCOUNTER — Ambulatory Visit
Admission: RE | Admit: 2020-07-02 | Discharge: 2020-07-02 | Disposition: A | Discharge status: Home / Self Care | Payer: No Typology Code available for payment source | Source: Ambulatory Visit | Attending: Obstetrics and Gynecology | Admitting: Obstetrics and Gynecology

## 2020-07-02 ENCOUNTER — Other Ambulatory Visit: Payer: No Typology Code available for payment source

## 2020-07-02 DIAGNOSIS — N63 Unspecified lump in unspecified breast: Secondary | ICD-10-CM

## 2020-07-04 ENCOUNTER — Other Ambulatory Visit: Payer: No Typology Code available for payment source

## 2020-12-04 ENCOUNTER — Encounter: Payer: Self-pay | Admitting: Family Medicine

## 2020-12-04 ENCOUNTER — Ambulatory Visit (INDEPENDENT_AMBULATORY_CARE_PROVIDER_SITE_OTHER): Payer: BC Managed Care – PPO | Admitting: Family Medicine

## 2020-12-04 ENCOUNTER — Other Ambulatory Visit: Payer: Self-pay | Admitting: Family Medicine

## 2020-12-04 ENCOUNTER — Other Ambulatory Visit: Payer: Self-pay

## 2020-12-04 VITALS — BP 120/90 | HR 100 | Temp 97.3°F | Resp 20 | Ht 63.0 in | Wt 242.0 lb

## 2020-12-04 DIAGNOSIS — H1013 Acute atopic conjunctivitis, bilateral: Secondary | ICD-10-CM | POA: Diagnosis not present

## 2020-12-04 DIAGNOSIS — J3089 Other allergic rhinitis: Secondary | ICD-10-CM | POA: Diagnosis not present

## 2020-12-04 DIAGNOSIS — J302 Other seasonal allergic rhinitis: Secondary | ICD-10-CM

## 2020-12-04 DIAGNOSIS — J4540 Moderate persistent asthma, uncomplicated: Secondary | ICD-10-CM

## 2020-12-04 DIAGNOSIS — H1010 Acute atopic conjunctivitis, unspecified eye: Secondary | ICD-10-CM

## 2020-12-04 MED ORDER — FLUTICASONE PROPIONATE 50 MCG/ACT NA SUSP: 2.0000 | Freq: Two times a day (BID) | NASAL | 5 refills | Status: DC

## 2020-12-04 MED ORDER — MONTELUKAST SODIUM 10 MG PO TABS: 10.0000 mg | ORAL_TABLET | Freq: Every day | ORAL | 5 refills | Status: DC

## 2020-12-04 MED ORDER — CETIRIZINE HCL 10 MG PO TABS: 10.0000 mg | ORAL_TABLET | Freq: Every day | ORAL | 5 refills | Status: DC

## 2020-12-04 MED ORDER — EPINEPHRINE 0.3 MG/0.3ML IJ SOAJ: INTRAMUSCULAR | 3 refills | Status: DC

## 2020-12-04 MED ORDER — OLOPATADINE HCL 0.1 % OP SOLN: 1.0000 [drp] | Freq: Two times a day (BID) | OPHTHALMIC | 10 refills | Status: DC

## 2020-12-04 NOTE — Patient Instructions (Signed)
Asthma Continue montelukast 10 mg once a day to prevent cough or wheeze Continue albuterol 2 puffs once every 4 hours as needed for cough or wheeze  Allergic rhinitis Continue allergen avoidance measures directed toward pollens, pets, mold, and dust mites as listed below Continue Flonase 2 sprays in each nostril once a day as needed for stuffy nose. In the right nostril, point the applicator out toward the right ear. In the left nostril, point the applicator out toward the left ear Consider saline nasal rinses as needed for nasal symptoms. Use this before any medicated nasal sprays for best result Begin cetirizine 10 mg once a day as needed for runny nose or itch Make an appointment for 2 to 3 weeks from today for your first allergy injection.  Have access to an epinephrine autoinjector set  Allergic conjunctivitis Continue Pataday eye drops one drop in each eye once a day as needed for red, itchy eyes  Call the clinic if this treatment plan is not working well for you  Follow up in 3 months or sooner if needed.  Reducing Pollen Exposure The American Academy of Allergy, Asthma and Immunology suggests the following steps to reduce your exposure to pollen during allergy seasons. 1. Do not hang sheets or clothing out to dry; pollen may collect on these items. 2. Do not mow lawns or spend time around freshly cut grass; mowing stirs up pollen. 3. Keep windows closed at night.  Keep car windows closed while driving. 4. Minimize morning activities outdoors, a time when pollen counts are usually at their highest. 5. Stay indoors as much as possible when pollen counts or humidity is high and on windy days when pollen tends to remain in the air longer. 6. Use air conditioning when possible.  Many air conditioners have filters that trap the pollen spores. 7. Use a HEPA room air filter to remove pollen form the indoor air you breathe.  Control of Dog or Cat Allergen Avoidance is the best way to  manage a dog or cat allergy. If you have a dog or cat and are allergic to dog or cats, consider removing the dog or cat from the home. If you have a dog or cat but don't want to find it a new home, or if your family wants a pet even though someone in the household is allergic, here are some strategies that may help keep symptoms at bay:  8. Keep the pet out of your bedroom and restrict it to only a few rooms. Be advised that keeping the dog or cat in only one room will not limit the allergens to that room. 9. Don't pet, hug or kiss the dog or cat; if you do, wash your hands with soap and water. 10. High-efficiency particulate air (HEPA) cleaners run continuously in a bedroom or living room can reduce allergen levels over time. 11. Regular use of a high-efficiency vacuum cleaner or a central vacuum can reduce allergen levels. 12. Giving your dog or cat a bath at least once a week can reduce airborne allergen.  Control of Mold Allergen Mold and fungi can grow on a variety of surfaces provided certain temperature and moisture conditions exist.  Outdoor molds grow on plants, decaying vegetation and soil.  The major outdoor mold, Alternaria and Cladosporium, are found in very high numbers during hot and dry conditions.  Generally, a late Summer - Fall peak is seen for common outdoor fungal spores.  Rain will temporarily lower outdoor mold spore count,  but counts rise rapidly when the rainy period ends.  The most important indoor molds are Aspergillus and Penicillium.  Dark, humid and poorly ventilated basements are ideal sites for mold growth.  The next most common sites of mold growth are the bathroom and the kitchen.  Outdoor [employer] 13. Use air conditioning and keep windows closed 14. Avoid exposure to decaying vegetation. 15. Avoid leaf raking. 16. Avoid grain handling. 17. Consider wearing a face mask if working in moldy areas.  Indoor Mold Control 1. Maintain humidity below 50%. 2. Clean  washable surfaces with 5% bleach solution. 3. Remove sources e.g. Contaminated carpets.   Control of Dust Mite Allergen Dust mites play a major role in allergic asthma and rhinitis. They occur in environments with high humidity wherever human skin is found. Dust mites absorb humidity from the atmosphere (ie, they do not drink) and feed on organic matter (including shed human and animal skin). Dust mites are a microscopic type of insect that you cannot see with the naked eye. High levels of dust mites have been detected from mattresses, pillows, carpets, upholstered furniture, bed covers, clothes, soft toys and any woven material. The principal allergen of the dust mite is found in its feces. A gram of dust may contain 1,000 mites and 250,000 fecal particles. Mite antigen is easily measured in the air during house cleaning activities. Dust mites do not bite and do not cause harm to humans, other than by triggering allergies/asthma.  Ways to decrease your exposure to dust mites in your home:  1. Encase mattresses, box springs and pillows with a mite-impermeable barrier or cover  2. Wash sheets, blankets and drapes weekly in hot water (130 F) with detergent and dry them in a dryer on the hot setting.  3. Have the room cleaned frequently with a vacuum cleaner and a damp dust-mop. For carpeting or rugs, vacuuming with a vacuum cleaner equipped with a high-efficiency particulate air (HEPA) filter. The dust mite allergic individual should not be in a room which is being cleaned and should wait 1 hour after cleaning before going into the room.  4. Do not sleep on upholstered furniture (eg, couches).  5. If possible removing carpeting, upholstered furniture and drapery from the home is ideal. Horizontal blinds should be eliminated in the rooms where the person spends the most time (bedroom, study, television room). Washable vinyl, roller-type shades are optimal.  6. Remove all non-washable stuffed toys  from the bedroom. Wash stuffed toys weekly like sheets and blankets above.  7. Reduce indoor humidity to less than 50%. Inexpensive humidity monitors can be purchased at most hardware stores. Do not use a humidifier as can make the problem worse and are not recommended.

## 2020-12-04 NOTE — Progress Notes (Signed)
[address] [place] [address] Dept: [phone]  FOLLOW UP NOTE  Patient ID: Melanie Rangel, female    DOB: March 10, 1981  Age: 40 y.o. MRN: [id number] Date of Office Visit: 12/04/2020  Assessment  Chief Complaint: Immunotherapy (Allergies been flaring up, restart injections.)  HPI Melanie Rangel is a 40 year old female who presents to the clinic for follow-up visit.  She was last seen in this clinic on 08/30/2018 for evaluation of asthma, allergic rhinitis, allergic conjunctivitis, and rash in the axilla area.  At today's visit, she reports her asthma has been moderately well controlled with occasional shortness of breath with activity, no wheeze, and occasional cough producing mucus and occurring in the daytime and nighttime.  She continues montelukast 10 mg once a day and uses albuterol about once every 2 months with relief of symptoms.  Allergic rhinitis is reported as poorly controlled with symptoms including clear thin rhinorrhea, nasal congestion, sneezing, and postnasal drainage with frequent throat clearing.  She continues Flonase daily and is not currently taking an antihistamine or using a nasal saline rinse.  She has previously been on allergen immunotherapy beginning in 2019 with her last injection directed toward tree pollen, weed pollen, ragweed pollen, grass pollen, mold, dust mite, cat hair, and dog epithelia on 01/03/2019.  At that time, she reports that she lost her job and her insurance and needed to stop allergy injections. She is interested in restarting allergy injections at this time.  Allergic conjunctivitis is reported as poorly controlled with symptoms including red and itchy eyes which began over the last 2 weeks.  She is not currently using an allergy eyedrop.  Her current medications are listed in the chart.   Drug Allergies:  Allergies  Allergen Reactions  . Aspirin Swelling    Other reaction(s): Eye Swelling    Physical Exam: BP 120/90 (BP Location: Left  Arm, Patient Position: Sitting)   Pulse 100   Temp (!) 97.3 F (36.3 C) (Temporal)   Resp 20   Ht 5\' 3"  (1.6 m)   Wt 242 lb (109.8 kg)   SpO2 98%   BMI 42.87 kg/m    Physical Exam Vitals reviewed.  Constitutional:      Appearance: Normal appearance.  HENT:     Head: Normocephalic and atraumatic.     Right Ear: Tympanic membrane normal.     Left Ear: Tympanic membrane normal.     Nose:     Comments: Bilateral nares edematous and pale with clear nasal drainage noted.  Pharynx normal.  Ears normal.  Eyes normal.    Mouth/Throat:     Pharynx: Oropharynx is clear.  Eyes:     Conjunctiva/sclera: Conjunctivae normal.  Cardiovascular:     Rate and Rhythm: Normal rate and regular rhythm.     Heart sounds: Normal heart sounds. No murmur heard.   Pulmonary:     Effort: Pulmonary effort is normal.     Breath sounds: Normal breath sounds.     Comments: Lungs clear to auscultation Musculoskeletal:        General: Normal range of motion.     Cervical back: Normal range of motion and neck supple.  Skin:    General: Skin is warm and dry.  Neurological:     Mental Status: She is alert and oriented to person, place, and time.  Psychiatric:        Mood and Affect: Mood normal.        Behavior: Behavior normal.  Thought Content: Thought content normal.        Judgment: Judgment normal.     Diagnostics: FVC 2.20, FEV1 1.80.  Predicted FVC 3.20, predicted FEV1 2.49.  Spirometry indicates possible mild restriction.  Assessment and Plan: 1. Moderate persistent asthma without complication   2. Seasonal and perennial allergic rhinitis   3. Seasonal allergic conjunctivitis     Meds ordered this encounter  Medications  . EPINEPHrine (AUVI-Q) 0.3 mg/0.3 mL IJ SOAJ injection    Sig: Use as directed for severe allergic reactions    Dispense:  2 each    Refill:  3  . montelukast (SINGULAIR) 10 MG tablet    Sig: Take 1 tablet (10 mg total) by mouth at bedtime.    Dispense:  30  tablet    Refill:  5  . fluticasone (FLONASE) 50 MCG/ACT nasal spray    Sig: Place 2 sprays into both nostrils 2 (two) times daily.    Dispense:  18.2 g    Refill:  5  . cetirizine (ZYRTEC) 10 MG tablet    Sig: Take 1 tablet (10 mg total) by mouth daily.    Dispense:  30 tablet    Refill:  5  . olopatadine (PATADAY) 0.1 % ophthalmic solution    Sig: Place 1 drop into both eyes 2 (two) times daily.    Dispense:  5 mL    Refill:  10    Patient Instructions  Asthma Continue montelukast 10 mg once a day to prevent cough or wheeze Continue albuterol 2 puffs once every 4 hours as needed for cough or wheeze Recheck spirometry at next visit  Allergic rhinitis Continue allergen avoidance measures directed toward pollens, pets, mold, and dust mites as listed below Continue Flonase 2 sprays in each nostril once a day as needed for stuffy nose. In the right nostril, point the applicator out toward the right ear. In the left nostril, point the applicator out toward the left ear Consider saline nasal rinses as needed for nasal symptoms. Use this before any medicated nasal sprays for best result Begin cetirizine 10 mg once a day as needed for runny nose or itch Make an appointment for 2 to 3 weeks from today for your first allergy injection.  Have access to an epinephrine autoinjector set  Allergic conjunctivitis Continue Pataday eye drops one drop in each eye once a day as needed for red, itchy eyes  Call the clinic if this treatment plan is not working well for you  Follow up in 3 months or sooner if needed.   Return in about 3 months (around 03/06/2021), or if symptoms worsen or fail to improve.    Thank you for the opportunity to care for this patient.  Please do not hesitate to contact me with questions.  Thermon Leyland, FNP [facility]

## 2020-12-05 NOTE — Telephone Encounter (Signed)
Please advise for change from olopatadine to azelastine eye drops. Thank you.

## 2020-12-05 NOTE — Telephone Encounter (Signed)
Please send azelastine eye drops one drop in each eye twice a day as needed for red or itchy eyes. Thank you

## 2020-12-06 ENCOUNTER — Telehealth: Payer: Self-pay | Admitting: Family Medicine

## 2020-12-06 MED ORDER — EPINEPHRINE 0.3 MG/0.3ML IJ SOAJ: INTRAMUSCULAR | 3 refills | Status: DC

## 2020-12-06 NOTE — Telephone Encounter (Signed)
Sent in EpiPen 0.3 mg to [facility]. Let message letting patient know we sent in some new medication for her and to check with her pharmacy.

## 2020-12-06 NOTE — Telephone Encounter (Signed)
Pharmacy needs approval for change in prescription, Ins will not cover Auvi- Q but will cover Epi-Pen.

## 2020-12-06 NOTE — Telephone Encounter (Signed)
Azelastine 0.05 eye drops sent to [facility] pharmacy x 1 with 5 refills.

## 2020-12-25 DIAGNOSIS — J301 Allergic rhinitis due to pollen: Secondary | ICD-10-CM | POA: Diagnosis not present

## 2020-12-25 NOTE — Progress Notes (Signed)
VIALS MADE & EXP 12-25-21 

## 2020-12-26 DIAGNOSIS — J3089 Other allergic rhinitis: Secondary | ICD-10-CM | POA: Diagnosis not present

## 2021-01-11 ENCOUNTER — Other Ambulatory Visit: Payer: Self-pay

## 2021-01-11 ENCOUNTER — Ambulatory Visit (INDEPENDENT_AMBULATORY_CARE_PROVIDER_SITE_OTHER): Payer: BC Managed Care – PPO | Admitting: *Deleted

## 2021-01-11 DIAGNOSIS — J309 Allergic rhinitis, unspecified: Secondary | ICD-10-CM | POA: Diagnosis not present

## 2021-01-17 ENCOUNTER — Ambulatory Visit (INDEPENDENT_AMBULATORY_CARE_PROVIDER_SITE_OTHER): Payer: BC Managed Care – PPO

## 2021-01-17 DIAGNOSIS — J309 Allergic rhinitis, unspecified: Secondary | ICD-10-CM | POA: Diagnosis not present

## 2021-01-25 ENCOUNTER — Ambulatory Visit (INDEPENDENT_AMBULATORY_CARE_PROVIDER_SITE_OTHER): Payer: BC Managed Care – PPO | Admitting: *Deleted

## 2021-01-25 DIAGNOSIS — J309 Allergic rhinitis, unspecified: Secondary | ICD-10-CM

## 2021-01-30 ENCOUNTER — Ambulatory Visit (INDEPENDENT_AMBULATORY_CARE_PROVIDER_SITE_OTHER): Payer: BC Managed Care – PPO

## 2021-01-30 DIAGNOSIS — J309 Allergic rhinitis, unspecified: Secondary | ICD-10-CM

## 2021-02-22 ENCOUNTER — Ambulatory Visit (INDEPENDENT_AMBULATORY_CARE_PROVIDER_SITE_OTHER): Payer: 59

## 2021-02-22 DIAGNOSIS — J309 Allergic rhinitis, unspecified: Secondary | ICD-10-CM

## 2021-02-27 ENCOUNTER — Ambulatory Visit (INDEPENDENT_AMBULATORY_CARE_PROVIDER_SITE_OTHER): Payer: 59

## 2021-02-27 DIAGNOSIS — J309 Allergic rhinitis, unspecified: Secondary | ICD-10-CM | POA: Diagnosis not present

## 2021-03-08 ENCOUNTER — Ambulatory Visit (INDEPENDENT_AMBULATORY_CARE_PROVIDER_SITE_OTHER): Payer: 59 | Admitting: *Deleted

## 2021-03-08 DIAGNOSIS — J309 Allergic rhinitis, unspecified: Secondary | ICD-10-CM | POA: Diagnosis not present

## 2021-03-13 ENCOUNTER — Ambulatory Visit (INDEPENDENT_AMBULATORY_CARE_PROVIDER_SITE_OTHER): Payer: 59

## 2021-03-13 DIAGNOSIS — J309 Allergic rhinitis, unspecified: Secondary | ICD-10-CM | POA: Diagnosis not present

## 2021-03-18 ENCOUNTER — Ambulatory Visit: Payer: BC Managed Care – PPO | Admitting: Family Medicine

## 2021-03-19 ENCOUNTER — Ambulatory Visit: Payer: BC Managed Care – PPO | Admitting: Family Medicine

## 2021-03-22 ENCOUNTER — Ambulatory Visit (INDEPENDENT_AMBULATORY_CARE_PROVIDER_SITE_OTHER): Payer: 59 | Admitting: *Deleted

## 2021-03-22 DIAGNOSIS — J309 Allergic rhinitis, unspecified: Secondary | ICD-10-CM

## 2021-03-29 ENCOUNTER — Ambulatory Visit (INDEPENDENT_AMBULATORY_CARE_PROVIDER_SITE_OTHER): Payer: 59

## 2021-03-29 DIAGNOSIS — J309 Allergic rhinitis, unspecified: Secondary | ICD-10-CM | POA: Diagnosis not present

## 2021-04-05 ENCOUNTER — Ambulatory Visit (INDEPENDENT_AMBULATORY_CARE_PROVIDER_SITE_OTHER): Payer: 59

## 2021-04-05 DIAGNOSIS — J309 Allergic rhinitis, unspecified: Secondary | ICD-10-CM

## 2021-04-09 ENCOUNTER — Ambulatory Visit: Payer: 59 | Admitting: Family Medicine

## 2021-04-09 ENCOUNTER — Encounter: Payer: Self-pay | Admitting: Family Medicine

## 2021-04-09 ENCOUNTER — Other Ambulatory Visit: Payer: Self-pay

## 2021-04-09 VITALS — BP 120/90 | HR 88 | Temp 97.7°F | Resp 12 | Ht 63.0 in | Wt 255.6 lb

## 2021-04-09 DIAGNOSIS — J309 Allergic rhinitis, unspecified: Secondary | ICD-10-CM

## 2021-04-09 DIAGNOSIS — J4540 Moderate persistent asthma, uncomplicated: Secondary | ICD-10-CM | POA: Diagnosis not present

## 2021-04-09 DIAGNOSIS — H1010 Acute atopic conjunctivitis, unspecified eye: Secondary | ICD-10-CM

## 2021-04-09 DIAGNOSIS — H1013 Acute atopic conjunctivitis, bilateral: Secondary | ICD-10-CM

## 2021-04-09 DIAGNOSIS — J302 Other seasonal allergic rhinitis: Secondary | ICD-10-CM | POA: Diagnosis not present

## 2021-04-09 MED ORDER — TRIAMCINOLONE ACETONIDE 0.1 % EX CREA: TOPICAL_CREAM | CUTANEOUS | 1 refills | Status: DC

## 2021-04-09 NOTE — Progress Notes (Signed)
[address] [place] [address] Dept: [phone]  FOLLOW UP NOTE  Patient ID: Melanie Rangel, female    DOB: Apr 22, 1981  Age: 40 y.o. MRN: [id number] Date of Office Visit: 04/09/2021  Assessment  Chief Complaint: Allergic Rhinitis  (/) and Asthma  HPI Melanie Rangel is a 40 year old female who presents to the clinic for a follow-up visit.  She was last seen in this clinic on 12/04/2020 by Thermon Leyland, FNP, for evaluation of asthma, allergic rhinitis, and allergic conjunctivitis.  At today's visit, she reports her asthma has been well controlled with no shortness of breath, cough, or wheeze with activity or rest.  She continues montelukast 10 mg once a day and uses albuterol about once every week or 2 with relief of symptoms.  Allergic rhinitis is reported as moderately well controlled with clear rhinorrhea occurring all season with an increase in the spring and fall seasons, occasional sneezing, and occasional postnasal drainage.  She continues cetirizine 10 mg once a day and Flonase daily with relief of symptoms.  She is not currently using a nasal saline rinse.  She continues allergen immunotherapy with no large or local reactions.  She reports a significant decrease in her symptoms of allergic rhinitis while continuing on allergen immunotherapy.  Allergic conjunctivitis is reported as moderately well controlled with occasional itchy eyes for which she is not currently using any medical intervention.  Atopic dermatitis is reported as well controlled with no red or itchy areas.  She continues a daily moisturizing routine with occasional use of triamcinolone with relief of symptoms.  Her current medications are listed in the chart.   Drug Allergies:  Allergies  Allergen Reactions   Aspirin Swelling    Other reaction(s): Eye Swelling    Physical Exam: BP 120/90 (BP Location: Right Arm, Patient Position: Sitting, Cuff Size: Large)   Pulse 88   Temp 97.7 F (36.5 C) (Temporal)   Resp 12    Ht 5\' 3"  (1.6 m)   Wt 255 lb 9.6 oz (115.9 kg)   SpO2 98%   BMI 45.28 kg/m    Physical Exam Vitals reviewed.  Constitutional:      Appearance: Normal appearance.  HENT:     Head: Normocephalic and atraumatic.     Right Ear: Tympanic membrane normal.     Left Ear: Tympanic membrane normal.     Nose:     Comments: Bilateral nares edematous and pale with clear nasal drainage noted.  Pharynx normal.  Ears normal.  Eyes normal.    Mouth/Throat:     Pharynx: Oropharynx is clear.  Eyes:     Conjunctiva/sclera: Conjunctivae normal.  Cardiovascular:     Rate and Rhythm: Normal rate and regular rhythm.     Heart sounds: Normal heart sounds. No murmur heard. Pulmonary:     Effort: Pulmonary effort is normal.     Breath sounds: Normal breath sounds.     Comments: Lungs clear to auscultation Musculoskeletal:        General: Normal range of motion.     Cervical back: Normal range of motion and neck supple.  Skin:    General: Skin is warm and dry.  Neurological:     Mental Status: She is alert and oriented to person, place, and time.  Psychiatric:        Mood and Affect: Mood normal.        Behavior: Behavior normal.        Thought Content: Thought content normal.  Judgment: Judgment normal.    Diagnostics: FVC 2.47, FEV1 1.78.  Predicted FVC 3.02, predicted FEV1 2.48.  Spirometry indicates normal ventilatory function.  Assessment and Plan: 1. Moderate persistent asthma without complication   2. Seasonal and perennial allergic rhinitis   3. Seasonal allergic conjunctivitis   4. Allergic rhinitis, unspecified seasonality, unspecified trigger     Meds ordered this encounter  Medications   triamcinolone cream (KENALOG) 0.1 %    Sig: Apply to stubborn red itchy areas below your face up to twice a day as needed    Dispense:  30 g    Refill:  1     Patient Instructions  Asthma Continue montelukast 10 mg once a day to prevent cough or wheeze Continue albuterol 2  puffs once every 4 hours as needed for cough or wheeze  Allergic rhinitis Continue allergen avoidance measures directed toward pollens, pets, mold, and dust mites as listed below Continue allergen immunotherapy once a week and have access to an epinephrine autoinjector set Continue cetirizine 10 mg once a day as needed for runny nose or itch Continue Flonase 2 sprays in each nostril once a day as needed for stuffy nose. In the right nostril, point the applicator out toward the right ear. In the left nostril, point the applicator out toward the left ear Consider saline nasal rinses as needed for nasal symptoms. Use this before any medicated nasal sprays for best result  Allergic conjunctivitis Continue Pataday eye drops one drop in each eye once a day as needed for red, itchy eyes  Atopic dermatitis Continue twice a day moisturizing routine Continue triamcinolone 0.1% cream to red itchy areas below your face up to twice a day as needed  Call the clinic if this treatment plan is not working well for you  Follow up in 6 months or sooner if needed.   Return in about 6 months (around 10/07/2021), or if symptoms worsen or fail to improve.    Thank you for the opportunity to care for this patient.  Please do not hesitate to contact me with questions.  Thermon Leyland, FNP [facility]

## 2021-04-09 NOTE — Patient Instructions (Addendum)
Asthma Continue montelukast 10 mg once a day to prevent cough or wheeze Continue albuterol 2 puffs once every 4 hours as needed for cough or wheeze  Allergic rhinitis Continue allergen avoidance measures directed toward pollens, pets, mold, and dust mites as listed below Continue allergen immunotherapy once a week and have access to an epinephrine autoinjector set Continue cetirizine 10 mg once a day as needed for runny nose or itch Continue Flonase 2 sprays in each nostril once a day as needed for stuffy nose. In the right nostril, point the applicator out toward the right ear. In the left nostril, point the applicator out toward the left ear Consider saline nasal rinses as needed for nasal symptoms. Use this before any medicated nasal sprays for best result  Allergic conjunctivitis Continue Pataday eye drops one drop in each eye once a day as needed for red, itchy eyes  Atopic dermatitis Continue twice a day moisturizing routine Continue triamcinolone 0.1% cream to red itchy areas below your face up to twice a day as needed  Call the clinic if this treatment plan is not working well for you  Follow up in 6 months or sooner if needed.  Reducing Pollen Exposure The American Academy of Allergy, Asthma and Immunology suggests the following steps to reduce your exposure to pollen during allergy seasons. Do not hang sheets or clothing out to dry; pollen may collect on these items. Do not mow lawns or spend time around freshly cut grass; mowing stirs up pollen. Keep windows closed at night.  Keep car windows closed while driving. Minimize morning activities outdoors, a time when pollen counts are usually at their highest. Stay indoors as much as possible when pollen counts or humidity is high and on windy days when pollen tends to remain in the air longer. Use air conditioning when possible.  Many air conditioners have filters that trap the pollen spores. Use a HEPA room air filter to remove  pollen form the indoor air you breathe.  Control of Dog or Cat Allergen Avoidance is the best way to manage a dog or cat allergy. If you have a dog or cat and are allergic to dog or cats, consider removing the dog or cat from the home. If you have a dog or cat but don't want to find it a new home, or if your family wants a pet even though someone in the household is allergic, here are some strategies that may help keep symptoms at bay:  Keep the pet out of your bedroom and restrict it to only a few rooms. Be advised that keeping the dog or cat in only one room will not limit the allergens to that room. Don't pet, hug or kiss the dog or cat; if you do, wash your hands with soap and water. High-efficiency particulate air (HEPA) cleaners run continuously in a bedroom or living room can reduce allergen levels over time. Regular use of a high-efficiency vacuum cleaner or a central vacuum can reduce allergen levels. Giving your dog or cat a bath at least once a week can reduce airborne allergen.  Control of Mold Allergen Mold and fungi can grow on a variety of surfaces provided certain temperature and moisture conditions exist.  Outdoor molds grow on plants, decaying vegetation and soil.  The major outdoor mold, Alternaria and Cladosporium, are found in very high numbers during hot and dry conditions.  Generally, a late Summer - Fall peak is seen for common outdoor fungal spores.  Rain will  temporarily lower outdoor mold spore count, but counts rise rapidly when the rainy period ends.  The most important indoor molds are Aspergillus and Penicillium.  Dark, humid and poorly ventilated basements are ideal sites for mold growth.  The next most common sites of mold growth are the bathroom and the kitchen.  Outdoor [employer] Use air conditioning and keep windows closed Avoid exposure to decaying vegetation. Avoid leaf raking. Avoid grain handling. Consider wearing a face mask if working in moldy  areas.  Indoor Mold Control Maintain humidity below 50%. Clean washable surfaces with 5% bleach solution. Remove sources e.g. Contaminated carpets.   Control of Dust Mite Allergen Dust mites play a major role in allergic asthma and rhinitis. They occur in environments with high humidity wherever human skin is found. Dust mites absorb humidity from the atmosphere (ie, they do not drink) and feed on organic matter (including shed human and animal skin). Dust mites are a microscopic type of insect that you cannot see with the naked eye. High levels of dust mites have been detected from mattresses, pillows, carpets, upholstered furniture, bed covers, clothes, soft toys and any woven material. The principal allergen of the dust mite is found in its feces. A gram of dust may contain 1,000 mites and 250,000 fecal particles. Mite antigen is easily measured in the air during house cleaning activities. Dust mites do not bite and do not cause harm to humans, other than by triggering allergies/asthma.  Ways to decrease your exposure to dust mites in your home:  1. Encase mattresses, box springs and pillows with a mite-impermeable barrier or cover  2. Wash sheets, blankets and drapes weekly in hot water (130 F) with detergent and dry them in a dryer on the hot setting.  3. Have the room cleaned frequently with a vacuum cleaner and a damp dust-mop. For carpeting or rugs, vacuuming with a vacuum cleaner equipped with a high-efficiency particulate air (HEPA) filter. The dust mite allergic individual should not be in a room which is being cleaned and should wait 1 hour after cleaning before going into the room.  4. Do not sleep on upholstered furniture (eg, couches).  5. If possible removing carpeting, upholstered furniture and drapery from the home is ideal. Horizontal blinds should be eliminated in the rooms where the person spends the most time (bedroom, study, television room). Washable vinyl, roller-type  shades are optimal.  6. Remove all non-washable stuffed toys from the bedroom. Wash stuffed toys weekly like sheets and blankets above.  7. Reduce indoor humidity to less than 50%. Inexpensive humidity monitors can be purchased at most hardware stores. Do not use a humidifier as can make the problem worse and are not recommended.

## 2021-04-22 ENCOUNTER — Ambulatory Visit (INDEPENDENT_AMBULATORY_CARE_PROVIDER_SITE_OTHER): Payer: 59

## 2021-04-22 DIAGNOSIS — J309 Allergic rhinitis, unspecified: Secondary | ICD-10-CM | POA: Diagnosis not present

## 2021-04-29 ENCOUNTER — Ambulatory Visit (INDEPENDENT_AMBULATORY_CARE_PROVIDER_SITE_OTHER): Payer: 59

## 2021-04-29 DIAGNOSIS — J309 Allergic rhinitis, unspecified: Secondary | ICD-10-CM

## 2021-05-08 ENCOUNTER — Ambulatory Visit (INDEPENDENT_AMBULATORY_CARE_PROVIDER_SITE_OTHER): Payer: 59

## 2021-05-08 DIAGNOSIS — J309 Allergic rhinitis, unspecified: Secondary | ICD-10-CM | POA: Diagnosis not present

## 2021-05-17 ENCOUNTER — Ambulatory Visit (INDEPENDENT_AMBULATORY_CARE_PROVIDER_SITE_OTHER): Payer: 59

## 2021-05-17 DIAGNOSIS — J309 Allergic rhinitis, unspecified: Secondary | ICD-10-CM

## 2021-05-22 ENCOUNTER — Ambulatory Visit (INDEPENDENT_AMBULATORY_CARE_PROVIDER_SITE_OTHER): Payer: 59

## 2021-05-22 DIAGNOSIS — J309 Allergic rhinitis, unspecified: Secondary | ICD-10-CM | POA: Diagnosis not present

## 2021-06-04 ENCOUNTER — Ambulatory Visit (INDEPENDENT_AMBULATORY_CARE_PROVIDER_SITE_OTHER): Payer: 59

## 2021-06-04 DIAGNOSIS — J309 Allergic rhinitis, unspecified: Secondary | ICD-10-CM | POA: Diagnosis not present

## 2021-06-11 ENCOUNTER — Ambulatory Visit (INDEPENDENT_AMBULATORY_CARE_PROVIDER_SITE_OTHER): Payer: 59

## 2021-06-11 DIAGNOSIS — J309 Allergic rhinitis, unspecified: Secondary | ICD-10-CM | POA: Diagnosis not present

## 2021-06-19 ENCOUNTER — Ambulatory Visit (INDEPENDENT_AMBULATORY_CARE_PROVIDER_SITE_OTHER): Payer: 59

## 2021-06-19 DIAGNOSIS — J309 Allergic rhinitis, unspecified: Secondary | ICD-10-CM | POA: Diagnosis not present

## 2021-07-02 ENCOUNTER — Ambulatory Visit (INDEPENDENT_AMBULATORY_CARE_PROVIDER_SITE_OTHER): Payer: 59

## 2021-07-02 DIAGNOSIS — J309 Allergic rhinitis, unspecified: Secondary | ICD-10-CM

## 2021-07-09 ENCOUNTER — Ambulatory Visit (INDEPENDENT_AMBULATORY_CARE_PROVIDER_SITE_OTHER): Payer: 59

## 2021-07-09 DIAGNOSIS — J309 Allergic rhinitis, unspecified: Secondary | ICD-10-CM | POA: Diagnosis not present

## 2021-07-16 ENCOUNTER — Ambulatory Visit (INDEPENDENT_AMBULATORY_CARE_PROVIDER_SITE_OTHER): Payer: 59

## 2021-07-16 DIAGNOSIS — J309 Allergic rhinitis, unspecified: Secondary | ICD-10-CM

## 2021-07-24 ENCOUNTER — Ambulatory Visit (INDEPENDENT_AMBULATORY_CARE_PROVIDER_SITE_OTHER): Payer: 59

## 2021-07-24 DIAGNOSIS — J309 Allergic rhinitis, unspecified: Secondary | ICD-10-CM

## 2021-07-30 ENCOUNTER — Ambulatory Visit (INDEPENDENT_AMBULATORY_CARE_PROVIDER_SITE_OTHER): Payer: 59

## 2021-07-30 DIAGNOSIS — J309 Allergic rhinitis, unspecified: Secondary | ICD-10-CM

## 2021-08-07 ENCOUNTER — Ambulatory Visit (INDEPENDENT_AMBULATORY_CARE_PROVIDER_SITE_OTHER): Payer: 59

## 2021-08-07 DIAGNOSIS — J309 Allergic rhinitis, unspecified: Secondary | ICD-10-CM | POA: Diagnosis not present

## 2021-08-14 ENCOUNTER — Ambulatory Visit (INDEPENDENT_AMBULATORY_CARE_PROVIDER_SITE_OTHER): Payer: 59

## 2021-08-14 DIAGNOSIS — J309 Allergic rhinitis, unspecified: Secondary | ICD-10-CM

## 2021-08-26 ENCOUNTER — Ambulatory Visit (INDEPENDENT_AMBULATORY_CARE_PROVIDER_SITE_OTHER): Payer: 59

## 2021-08-26 DIAGNOSIS — J309 Allergic rhinitis, unspecified: Secondary | ICD-10-CM

## 2021-09-03 ENCOUNTER — Other Ambulatory Visit: Payer: Self-pay | Admitting: Family Medicine

## 2021-09-03 DIAGNOSIS — Z1231 Encounter for screening mammogram for malignant neoplasm of breast: Secondary | ICD-10-CM

## 2021-09-04 ENCOUNTER — Ambulatory Visit (INDEPENDENT_AMBULATORY_CARE_PROVIDER_SITE_OTHER): Payer: 59

## 2021-09-04 DIAGNOSIS — J309 Allergic rhinitis, unspecified: Secondary | ICD-10-CM

## 2021-09-10 ENCOUNTER — Ambulatory Visit (HOSPITAL_BASED_OUTPATIENT_CLINIC_OR_DEPARTMENT_OTHER)
Admission: RE | Admit: 2021-09-10 | Discharge: 2021-09-10 | Disposition: A | Discharge status: Home / Self Care | Payer: 59 | Source: Ambulatory Visit | Attending: Family | Admitting: Family

## 2021-09-10 ENCOUNTER — Encounter (HOSPITAL_BASED_OUTPATIENT_CLINIC_OR_DEPARTMENT_OTHER): Payer: Self-pay

## 2021-09-10 ENCOUNTER — Other Ambulatory Visit: Payer: Self-pay

## 2021-09-10 ENCOUNTER — Ambulatory Visit (HOSPITAL_BASED_OUTPATIENT_CLINIC_OR_DEPARTMENT_OTHER)
Admission: RE | Admit: 2021-09-10 | Discharge: 2021-09-10 | Disposition: A | Discharge status: Home / Self Care | Payer: 59 | Source: Ambulatory Visit | Attending: Family Medicine | Admitting: Family Medicine

## 2021-09-10 ENCOUNTER — Encounter: Payer: Self-pay | Admitting: Family

## 2021-09-10 ENCOUNTER — Ambulatory Visit: Payer: 59 | Admitting: Family

## 2021-09-10 ENCOUNTER — Other Ambulatory Visit (HOSPITAL_BASED_OUTPATIENT_CLINIC_OR_DEPARTMENT_OTHER): Payer: Self-pay | Admitting: Family Medicine

## 2021-09-10 VITALS — BP 118/80 | HR 88 | Temp 97.7°F | Resp 17 | Wt 254.0 lb

## 2021-09-10 DIAGNOSIS — J3089 Other allergic rhinitis: Secondary | ICD-10-CM | POA: Diagnosis not present

## 2021-09-10 DIAGNOSIS — J4540 Moderate persistent asthma, uncomplicated: Secondary | ICD-10-CM | POA: Diagnosis not present

## 2021-09-10 DIAGNOSIS — H1010 Acute atopic conjunctivitis, unspecified eye: Secondary | ICD-10-CM

## 2021-09-10 DIAGNOSIS — H1013 Acute atopic conjunctivitis, bilateral: Secondary | ICD-10-CM | POA: Diagnosis not present

## 2021-09-10 DIAGNOSIS — Z1231 Encounter for screening mammogram for malignant neoplasm of breast: Secondary | ICD-10-CM

## 2021-09-10 DIAGNOSIS — J302 Other seasonal allergic rhinitis: Secondary | ICD-10-CM

## 2021-09-10 DIAGNOSIS — R051 Acute cough: Secondary | ICD-10-CM | POA: Insufficient documentation

## 2021-09-10 MED ORDER — ALBUTEROL SULFATE HFA 108 (90 BASE) MCG/ACT IN AERS: INHALATION_SPRAY | RESPIRATORY_TRACT | 1 refills | Status: DC

## 2021-09-10 MED ORDER — FLUTICASONE PROPIONATE 50 MCG/ACT NA SUSP: NASAL | 1 refills | Status: DC

## 2021-09-10 MED ORDER — TRIAMCINOLONE ACETONIDE 0.1 % EX CREA: TOPICAL_CREAM | CUTANEOUS | 1 refills | Status: DC

## 2021-09-10 MED ORDER — OLOPATADINE HCL 0.2 % OP SOLN: OPHTHALMIC | 1 refills | Status: AC

## 2021-09-10 MED ORDER — MONTELUKAST SODIUM 10 MG PO TABS: 10.0000 mg | ORAL_TABLET | Freq: Every day | ORAL | 1 refills | Status: DC

## 2021-09-10 MED ORDER — CETIRIZINE HCL 10 MG PO TABS: 10.0000 mg | ORAL_TABLET | Freq: Every day | ORAL | 1 refills | Status: AC

## 2021-09-10 NOTE — Progress Notes (Signed)
Please let Melanie Rangel know that her chest x-ray is normal. This is good news!

## 2021-09-10 NOTE — Patient Instructions (Addendum)
Asthma Continue montelukast 10 mg once a day to prevent cough or wheeze Continue albuterol 2 puffs once every 4 hours as needed for cough or wheeze Get STAT PA/Lateral chest chest x-ray due to cough Start Flovent 110 mcg 2 puffs twice a day with spacer to help prevent cough and wheeze. Spacer given.  Allergic rhinitis Continue allergen avoidance measures directed toward pollens, pets, mold, and dust mites as listed below Continue allergen immunotherapy once a week and have access to an epinephrine autoinjector set.  Hold off on getting your allergy injections this week until we get your breathing under better control. Continue (Zyrtec) cetirizine 10 mg once a day as needed for runny nose or itch Continue (Flonase) fluticasone 2 sprays in each nostril once a day as needed for stuffy nose.  Stop using Flonase nasal spray for the next 5 to 7 days due to the irritation noted in both nostrils. In the right nostril, point the applicator out toward the right ear. In the left nostril, point the applicator out toward the left ear. If you continue to have nose bleeds after using proper technique. Start saline gel to help nasal dryness  Consider saline nasal rinses as needed for nasal symptoms. Use this before any medicated nasal sprays for best result  Allergic conjunctivitis Continue Pataday eye drops one drop in each eye once a day as needed for red, itchy eyes  Atopic dermatitis Continue twice a day moisturizing routine Continue triamcinolone 0.1% cream twice a day as needed to red/itchy areas. Do not use on face, neck, groin, or armpit region  Call and schedule an appointment with your primary care physician to discuss the sensation of your heart beating fast and hard so they can schedule further testing.  Also discuss your syncopal episode that you had this past Sunday with your primary care physician.  Should you have any further syncopal episodes recommend going to the emergency room  Call the  clinic if this treatment plan is not working well for you  Follow up in 4 to 6 weeks or sooner if needed.  Reducing Pollen Exposure The American Academy of Allergy, Asthma and Immunology suggests the following steps to reduce your exposure to pollen during allergy seasons. Do not hang sheets or clothing out to dry; pollen may collect on these items. Do not mow lawns or spend time around freshly cut grass; mowing stirs up pollen. Keep windows closed at night.  Keep car windows closed while driving. Minimize morning activities outdoors, a time when pollen counts are usually at their highest. Stay indoors as much as possible when pollen counts or humidity is high and on windy days when pollen tends to remain in the air longer. Use air conditioning when possible.  Many air conditioners have filters that trap the pollen spores. Use a HEPA room air filter to remove pollen form the indoor air you breathe.  Control of Dog or Cat Allergen Avoidance is the best way to manage a dog or cat allergy. If you have a dog or cat and are allergic to dog or cats, consider removing the dog or cat from the home. If you have a dog or cat but dont want to find it a new home, or if your family wants a pet even though someone in the household is allergic, here are some strategies that may help keep symptoms at bay:  Keep the pet out of your bedroom and restrict it to only a few rooms. Be advised that keeping the  dog or cat in only one room will not limit the allergens to that room. Dont pet, hug or kiss the dog or cat; if you do, wash your hands with soap and water. High-efficiency particulate air (HEPA) cleaners run continuously in a bedroom or living room can reduce allergen levels over time. Regular use of a high-efficiency vacuum cleaner or a central vacuum can reduce allergen levels. Giving your dog or cat a bath at least once a week can reduce airborne allergen.  Control of Mold Allergen Mold and fungi can  grow on a variety of surfaces provided certain temperature and moisture conditions exist.  Outdoor molds grow on plants, decaying vegetation and soil.  The major outdoor mold, Alternaria and Cladosporium, are found in very high numbers during hot and dry conditions.  Generally, a late Summer - Fall peak is seen for common outdoor fungal spores.  Rain will temporarily lower outdoor mold spore count, but counts rise rapidly when the rainy period ends.  The most important indoor molds are Aspergillus and Penicillium.  Dark, humid and poorly ventilated basements are ideal sites for mold growth.  The next most common sites of mold growth are the bathroom and the kitchen.  Outdoor [employer] Use air conditioning and keep windows closed Avoid exposure to decaying vegetation. Avoid leaf raking. Avoid grain handling. Consider wearing a face mask if working in moldy areas.  Indoor Mold Control Maintain humidity below 50%. Clean washable surfaces with 5% bleach solution. Remove sources e.g. Contaminated carpets.   Control of Dust Mite Allergen Dust mites play a major role in allergic asthma and rhinitis. They occur in environments with high humidity wherever human skin is found. Dust mites absorb humidity from the atmosphere (ie, they do not drink) and feed on organic matter (including shed human and animal skin). Dust mites are a microscopic type of insect that you cannot see with the naked eye. High levels of dust mites have been detected from mattresses, pillows, carpets, upholstered furniture, bed covers, clothes, soft toys and any woven material. The principal allergen of the dust mite is found in its feces. A gram of dust may contain 1,000 mites and 250,000 fecal particles. Mite antigen is easily measured in the air during house cleaning activities. Dust mites do not bite and do not cause harm to humans, other than by triggering allergies/asthma.  Ways to decrease your exposure to dust mites in your  home:  1. Encase mattresses, box springs and pillows with a mite-impermeable barrier or cover  2. Wash sheets, blankets and drapes weekly in hot water (130 F) with detergent and dry them in a dryer on the hot setting.  3. Have the room cleaned frequently with a vacuum cleaner and a damp dust-mop. For carpeting or rugs, vacuuming with a vacuum cleaner equipped with a high-efficiency particulate air (HEPA) filter. The dust mite allergic individual should not be in a room which is being cleaned and should wait 1 hour after cleaning before going into the room.  4. Do not sleep on upholstered furniture (eg, couches).  5. If possible removing carpeting, upholstered furniture and drapery from the home is ideal. Horizontal blinds should be eliminated in the rooms where the person spends the most time (bedroom, study, television room). Washable vinyl, roller-type shades are optimal.  6. Remove all non-washable stuffed toys from the bedroom. Wash stuffed toys weekly like sheets and blankets above.  7. Reduce indoor humidity to less than 50%. Inexpensive humidity monitors can be purchased at  most hardware stores. Do not use a humidifier as can make the problem worse and are not recommended.

## 2021-09-10 NOTE — Progress Notes (Signed)
[address] [place] [address] Dept: [phone]  FOLLOW UP NOTE  Patient ID: Melanie Rangel, female    DOB: 05/29/1981  Age: 41 y.o. MRN: [id number] Date of Office Visit: 09/10/2021  Assessment  Chief Complaint: Cough and Shortness of Breath (Pt states she had COVID end of January and since her breathing haven't been good. She have been coughing, SOB.)  HPI Melanie Rangel is a 41 year old female who presents today for an acute visit.  She was last seen on April 09, 2021 by Thermon Leyland, FNP for moderate persistent asthma, seasonal and perennial allergic rhinitis, and seasonal allergic conjunctivitis.  She reports since her last office visit she was diagnosed with COVID-19 at the end of January.  Also, this past Sunday she reports that her mom was telling her to joke and she began laughing and started coughing and she could not catch her breath.  She reports that she then blacked out, collapsed on the floor and knocked over a plant in the kitchen.  She did not go to the emergency room and she has not contacted her primary care physician.  Moderate persistent asthma is reported as not well controlled with Singulair 10 mg once a day and albuterol as needed.  She reports since having COVID-19 the end of January that her asthma has been flaring.  She reports a dry cough, shortness of breath, nocturnal awakenings sometimes, and the sensation of being able to feel her lungs.  She denies fever, chills, wheezing and tightness in her chest.  Since her last office visit she has not made any trips to the emergency room or urgent care due to breathing problems.  She did receive 1 round of steroids after getting sick while in [place].  For the past 2 weeks she has been using her albuterol inhaler twice a day and prior to that she was only having to use it once or twice a week.  She does report that when she uses the albuterol it does help with her symptoms.  She denies any history of blood clots, no recent  surgeries, or swelling of extremities.  Allergic rhinitis is reported as not well controlled with cetirizine 10 mg once a day, allergy injections once a week, Flonase 2 sprays each nostril once a day, and saline rinses as needed.  She does feel like her allergy injections do help and she denies large local reactions.  She reports rhinorrhea that is bloody or has blood clots.  She mentions that this has been doing this for months and usually does this in the winter and does not occur in the summer.  She also reports postnasal drip and nasal congestion.  She is not sure if her nose feels dry, but reports she does run a humidifier in her house.  She was treated for a sinus infection with Augmentin when she had COVID-19 at the end of January.  Discussed proper technique of nasal sprays and it was found that she was using proper technique.  She mentions that she does not have nosebleeds in the summer and she uses Flonase every day then too.  Allergic conjunctivitis is reported as not well controlled.  She is not sure where her Pataday eyedrops are.  She reports itchy eyes.  Atopic dermatitis is reported as moderately controlled with Vaseline for moisturization and triamcinolone 0.1% cream as needed.  She reports occasional flareups and reports that her eczema has been doing better than the rest of her body.  Drug Allergies:  Allergies  Allergen Reactions   Aspirin Swelling    Other reaction(s): Eye Swelling    Review of Systems: Review of Systems  Constitutional:  Negative for chills and fever.  HENT:  Positive for congestion and nosebleeds.        Reports rhinorrhea that has blood clots in it sometimes, and sometimes bloody.  Also reports postnasal drip and nasal congestion.  She has not certain if she has nasal dryness, but does mention that she runs a humidifier.  Eyes:        Reports itchy watery eyes  Respiratory:  Positive for cough and shortness of breath. Negative for wheezing.         Reports a dry cough, shortness of breath, nocturnal awakening symptoms, and the sensation of being able to feel her lungs.  Denies wheezing and tightness in chest  Cardiovascular:  Negative for chest pain and palpitations.       Reports sometimes that her heart beats fast and hard  Gastrointestinal:  Positive for heartburn.       Reports heartburn symptoms for which she will take omeprazole as needed.  Denies reflux  Genitourinary:  Positive for frequency.       Reports increased frequency of urination, but reports she drinks water and  Skin:  Negative for itching and rash.  Neurological:  Positive for headaches. Negative for dizziness.       Reports history of migraines since a child  Endo/Heme/Allergies:  Positive for environmental allergies.    Physical Exam: BP 118/80 (BP Location: Right Arm, Patient Position: Sitting)    Pulse 88    Temp 97.7 F (36.5 C) (Temporal)    Resp 17    Wt 254 lb (115.2 kg)    SpO2 99%    BMI 44.99 kg/m    Physical Exam Constitutional:      Appearance: Normal appearance.  HENT:     Head: Normocephalic and atraumatic.     Comments: Pharynx normal, eyes normal, ears normal, nose: Bilateral lower turbinates moderately edematous, with clear drainage and irritation with slight blood noted on left and right nostril    Right Ear: Tympanic membrane, ear canal and external ear normal.     Left Ear: Tympanic membrane, ear canal and external ear normal.     Mouth/Throat:     Mouth: Mucous membranes are moist.     Pharynx: Oropharynx is clear.  Eyes:     Conjunctiva/sclera: Conjunctivae normal.  Cardiovascular:     Rate and Rhythm: Normal rate and regular rhythm.     Pulses: Normal pulses.     Heart sounds: Normal heart sounds.  Pulmonary:     Effort: Pulmonary effort is normal.     Breath sounds: Normal breath sounds.     Comments: Lungs clear to auscultation Musculoskeletal:     Cervical back: Neck supple.  Skin:    General: Skin is warm.  Neurological:      Mental Status: She is alert and oriented to person, place, and time.  Psychiatric:        Mood and Affect: Mood normal.        Behavior: Behavior normal.        Thought Content: Thought content normal.        Judgment: Judgment normal.    Diagnostics: FVC 1.86 L, FEV1 1.54 L (62%).  Predicted FVC 3.01 L, predicted FEV1 2.47 L.  Spirometry indicates possible moderate restriction postbronchodilator response shows FVC 1.83 L, FEV1 1.59 L.  There  is a 3% change in FEV1.  Spirometry indicates possible moderate restriction.  Assessment and Plan: 1. Not well controlled moderate persistent asthma   2. Acute cough   3. Seasonal and perennial allergic rhinitis   4. Seasonal allergic conjunctivitis     No orders of the defined types were placed in this encounter.   Patient Instructions  Asthma Continue montelukast 10 mg once a day to prevent cough or wheeze Continue albuterol 2 puffs once every 4 hours as needed for cough or wheeze Get STAT PA/Lateral chest chest x-ray due to cough Start Flovent 110 mcg 2 puffs twice a day with spacer to help prevent cough and wheeze. Spacer given.  Allergic rhinitis Continue allergen avoidance measures directed toward pollens, pets, mold, and dust mites as listed below Continue allergen immunotherapy once a week and have access to an epinephrine autoinjector set.  Hold off on getting your allergy injections this week until we get your breathing under better control. Continue (Zyrtec) cetirizine 10 mg once a day as needed for runny nose or itch Continue (Flonase) fluticasone 2 sprays in each nostril once a day as needed for stuffy nose.  Stop using Flonase nasal spray for the next 5 to 7 days due to the irritation noted in both nostrils. In the right nostril, point the applicator out toward the right ear. In the left nostril, point the applicator out toward the left ear. If you continue to have nose bleeds after using proper technique. Start saline gel to  help nasal dryness  Consider saline nasal rinses as needed for nasal symptoms. Use this before any medicated nasal sprays for best result  Allergic conjunctivitis Continue Pataday eye drops one drop in each eye once a day as needed for red, itchy eyes  Atopic dermatitis Continue twice a day moisturizing routine Continue triamcinolone 0.1% cream twice a day as needed to red/itchy areas. Do not use on face, neck, groin, or armpit region  Call and schedule an appointment with your primary care physician to discuss the sensation of your heart beating fast and hard so they can schedule further testing.  Also discuss your syncopal episode that you had this past Sunday with your primary care physician.  Should you have any further syncopal episodes recommend going to the emergency room  Call the clinic if this treatment plan is not working well for you  Follow up in 4 to 6 weeks or sooner if needed.  Reducing Pollen Exposure The American Academy of Allergy, Asthma and Immunology suggests the following steps to reduce your exposure to pollen during allergy seasons. Do not hang sheets or clothing out to dry; pollen may collect on these items. Do not mow lawns or spend time around freshly cut grass; mowing stirs up pollen. Keep windows closed at night.  Keep car windows closed while driving. Minimize morning activities outdoors, a time when pollen counts are usually at their highest. Stay indoors as much as possible when pollen counts or humidity is high and on windy days when pollen tends to remain in the air longer. Use air conditioning when possible.  Many air conditioners have filters that trap the pollen spores. Use a HEPA room air filter to remove pollen form the indoor air you breathe.  Control of Dog or Cat Allergen Avoidance is the best way to manage a dog or cat allergy. If you have a dog or cat and are allergic to dog or cats, consider removing the dog or cat from the home. If  you have  a dog or cat but dont want to find it a new home, or if your family wants a pet even though someone in the household is allergic, here are some strategies that may help keep symptoms at bay:  Keep the pet out of your bedroom and restrict it to only a few rooms. Be advised that keeping the dog or cat in only one room will not limit the allergens to that room. Dont pet, hug or kiss the dog or cat; if you do, wash your hands with soap and water. High-efficiency particulate air (HEPA) cleaners run continuously in a bedroom or living room can reduce allergen levels over time. Regular use of a high-efficiency vacuum cleaner or a central vacuum can reduce allergen levels. Giving your dog or cat a bath at least once a week can reduce airborne allergen.  Control of Mold Allergen Mold and fungi can grow on a variety of surfaces provided certain temperature and moisture conditions exist.  Outdoor molds grow on plants, decaying vegetation and soil.  The major outdoor mold, Alternaria and Cladosporium, are found in very high numbers during hot and dry conditions.  Generally, a late Summer - Fall peak is seen for common outdoor fungal spores.  Rain will temporarily lower outdoor mold spore count, but counts rise rapidly when the rainy period ends.  The most important indoor molds are Aspergillus and Penicillium.  Dark, humid and poorly ventilated basements are ideal sites for mold growth.  The next most common sites of mold growth are the bathroom and the kitchen.  Outdoor Microsoft[employer]old Control Use air conditioning and keep windows closed Avoid exposure to decaying vegetation. Avoid leaf raking. Avoid grain handling. Consider wearing a face mask if working in moldy areas.  Indoor Mold Control Maintain humidity below 50%. Clean washable surfaces with 5% bleach solution. Remove sources e.g. Contaminated carpets.   Control of Dust Mite Allergen Dust mites play a major role in allergic asthma and rhinitis. They  occur in environments with high humidity wherever human skin is found. Dust mites absorb humidity from the atmosphere (ie, they do not drink) and feed on organic matter (including shed human and animal skin). Dust mites are a microscopic type of insect that you cannot see with the naked eye. High levels of dust mites have been detected from mattresses, pillows, carpets, upholstered furniture, bed covers, clothes, soft toys and any woven material. The principal allergen of the dust mite is found in its feces. A gram of dust may contain 1,000 mites and 250,000 fecal particles. Mite antigen is easily measured in the air during house cleaning activities. Dust mites do not bite and do not cause harm to humans, other than by triggering allergies/asthma.  Ways to decrease your exposure to dust mites in your home:  1. Encase mattresses, box springs and pillows with a mite-impermeable barrier or cover  2. Wash sheets, blankets and drapes weekly in hot water (130 F) with detergent and dry them in a dryer on the hot setting.  3. Have the room cleaned frequently with a vacuum cleaner and a damp dust-mop. For carpeting or rugs, vacuuming with a vacuum cleaner equipped with a high-efficiency particulate air (HEPA) filter. The dust mite allergic individual should not be in a room which is being cleaned and should wait 1 hour after cleaning before going into the room.  4. Do not sleep on upholstered furniture (eg, couches).  5. If possible removing carpeting, upholstered furniture and drapery from the home  is ideal. Horizontal blinds should be eliminated in the rooms where the person spends the most time (bedroom, study, television room). Washable vinyl, roller-type shades are optimal.  6. Remove all non-washable stuffed toys from the bedroom. Wash stuffed toys weekly like sheets and blankets above.  7. Reduce indoor humidity to less than 50%. Inexpensive humidity monitors can be purchased at most hardware stores.  Do not use a humidifier as can make the problem worse and are not recommended.   Return in about 6 weeks (around 10/22/2021), or if symptoms worsen or fail to improve.    Thank you for the opportunity to care for this patient.  Please do not hesitate to contact me with questions.  Nehemiah Settlehristine Vraj Denardo, FNP [facility]

## 2021-09-12 ENCOUNTER — Other Ambulatory Visit: Payer: Self-pay | Admitting: Family Medicine

## 2021-09-12 DIAGNOSIS — R928 Other abnormal and inconclusive findings on diagnostic imaging of breast: Secondary | ICD-10-CM

## 2021-09-16 ENCOUNTER — Ambulatory Visit (INDEPENDENT_AMBULATORY_CARE_PROVIDER_SITE_OTHER): Payer: 59

## 2021-09-16 ENCOUNTER — Other Ambulatory Visit: Payer: Self-pay

## 2021-09-16 ENCOUNTER — Ambulatory Visit: Payer: No Typology Code available for payment source

## 2021-09-16 DIAGNOSIS — J309 Allergic rhinitis, unspecified: Secondary | ICD-10-CM | POA: Diagnosis not present

## 2021-09-16 MED ORDER — FLUTICASONE PROPIONATE HFA 110 MCG/ACT IN AERO: 2.0000 | INHALATION_SPRAY | Freq: Two times a day (BID) | RESPIRATORY_TRACT | 1 refills | Status: DC

## 2021-09-17 ENCOUNTER — Ambulatory Visit: Payer: No Typology Code available for payment source

## 2021-09-17 ENCOUNTER — Other Ambulatory Visit: Payer: Self-pay | Admitting: Family Medicine

## 2021-09-17 ENCOUNTER — Ambulatory Visit
Admission: RE | Admit: 2021-09-17 | Discharge: 2021-09-17 | Disposition: A | Discharge status: Home / Self Care | Payer: 59 | Source: Ambulatory Visit | Attending: Family Medicine | Admitting: Family Medicine

## 2021-09-17 DIAGNOSIS — R928 Other abnormal and inconclusive findings on diagnostic imaging of breast: Secondary | ICD-10-CM

## 2021-09-17 DIAGNOSIS — R921 Mammographic calcification found on diagnostic imaging of breast: Secondary | ICD-10-CM

## 2021-09-25 ENCOUNTER — Ambulatory Visit (INDEPENDENT_AMBULATORY_CARE_PROVIDER_SITE_OTHER): Payer: 59

## 2021-09-25 DIAGNOSIS — J309 Allergic rhinitis, unspecified: Secondary | ICD-10-CM

## 2021-10-02 ENCOUNTER — Ambulatory Visit (INDEPENDENT_AMBULATORY_CARE_PROVIDER_SITE_OTHER): Payer: 59

## 2021-10-02 DIAGNOSIS — J309 Allergic rhinitis, unspecified: Secondary | ICD-10-CM

## 2021-10-08 ENCOUNTER — Ambulatory Visit: Payer: 59 | Admitting: Family

## 2021-10-08 ENCOUNTER — Ambulatory Visit (INDEPENDENT_AMBULATORY_CARE_PROVIDER_SITE_OTHER): Payer: 59

## 2021-10-08 DIAGNOSIS — J309 Allergic rhinitis, unspecified: Secondary | ICD-10-CM

## 2021-10-11 ENCOUNTER — Other Ambulatory Visit: Payer: Self-pay

## 2021-10-11 MED ORDER — ALBUTEROL SULFATE HFA 108 (90 BASE) MCG/ACT IN AERS: 2.0000 | INHALATION_SPRAY | RESPIRATORY_TRACT | 5 refills | Status: DC | PRN

## 2021-10-14 ENCOUNTER — Other Ambulatory Visit: Payer: Self-pay

## 2021-10-14 MED ORDER — ALBUTEROL SULFATE HFA 108 (90 BASE) MCG/ACT IN AERS: INHALATION_SPRAY | RESPIRATORY_TRACT | 1 refills | Status: DC

## 2021-10-14 NOTE — Patient Instructions (Addendum)
Asthma ?Continue montelukast 10 mg once a day to prevent cough or wheeze ?Continue albuterol 2 puffs once every 4 hours as needed for cough or wheeze ?Increase Flovent 110 mcg 2 puffs twice a day and start using with spacer to help prevent cough and wheeze.  Proper demonstration given ? ?Allergic rhinitis ?Continue allergen avoidance measures directed toward pollens, pets, mold, and dust mites as listed below ?Continue allergen immunotherapy once a week and have access to an epinephrine autoinjector set.   ?Continue (Zyrtec) cetirizine 10 mg once a day as needed for runny nose or itch ?Continue to not use (Flonase) fluticasone due to nasal sprays ?Start saline gel to help nasal dryness ?Consider saline nasal rinses as needed for nasal symptoms. Use this before any medicated nasal sprays for best result ? ?Allergic conjunctivitis ?Continue Pataday eye drops one drop in each eye once a day as needed for red, itchy eyes ? ?Atopic dermatitis ?Continue twice a day moisturizing routine ?Continue triamcinolone 0.1% cream twice a day as needed to red/itchy areas. Do not use on face, neck, groin, or armpit region ? ?Epistaxis ?Pinch both nostrils while leaning forward for at least 5 minutes before checking to see if the bleeding has stopped. If bleeding is not controlled within 5-10 minutes apply a cotton ball soaked with oxymetazoline (Afrin) to the bleeding nostril for a few seconds.  ?If the problem persists or worsens a referral to ENT for further evaluation may be necessary. ?We will put in for a referral for ENT.  ? ?Your blood pressure is elevated today while in the office.  Please schedule an appointment with your primary care physician to discuss your elevated blood pressure. ? ?Call the clinic if this treatment plan is not working well for you ? ?Follow up in  2 months or sooner if needed. ? ?Reducing Pollen Exposure ?The American Academy of Allergy, Asthma and Immunology suggests the following steps to reduce your  exposure to pollen during allergy seasons. ?Do not hang sheets or clothing out to dry; pollen may collect on these items. ?Do not mow lawns or spend time around freshly cut grass; mowing stirs up pollen. ?Keep windows closed at night.  Keep car windows closed while driving. ?Minimize morning activities outdoors, a time when pollen counts are usually at their highest. ?Stay indoors as much as possible when pollen counts or humidity is high and on windy days when pollen tends to remain in the air longer. ?Use air conditioning when possible.  Many air conditioners have filters that trap the pollen spores. ?Use a HEPA room air filter to remove pollen form the indoor air you breathe. ? ?Control of Dog or Cat Allergen ?Avoidance is the best way to manage a dog or cat allergy. If you have a dog or cat and are allergic to dog or cats, consider removing the dog or cat from the home. ?If you have a dog or cat but don?t want to find it a new home, or if your family wants a pet even though someone in the household is allergic, here are some strategies that may help keep symptoms at bay: ? ?Keep the pet out of your bedroom and restrict it to only a few rooms. Be advised that keeping the dog or cat in only one room will not limit the allergens to that room. ?Don?t pet, hug or kiss the dog or cat; if you do, wash your hands with soap and water. ?High-efficiency particulate air (HEPA) cleaners run continuously in a  bedroom or living room can reduce allergen levels over time. ?Regular use of a high-efficiency vacuum cleaner or a central vacuum can reduce allergen levels. ?Giving your dog or cat a bath at least once a week can reduce airborne allergen. ? ?Control of Mold Allergen ?Mold and fungi can grow on a variety of surfaces provided certain temperature and moisture conditions exist.  Outdoor molds grow on plants, decaying vegetation and soil.  The major outdoor mold, Alternaria and Cladosporium, are found in very high numbers  during hot and dry conditions.  Generally, a late Summer - Fall peak is seen for common outdoor fungal spores.  Rain will temporarily lower outdoor mold spore count, but counts rise rapidly when the rainy period ends.  The most important indoor molds are Aspergillus and Penicillium.  Dark, humid and poorly ventilated basements are ideal sites for mold growth.  The next most common sites of mold growth are the bathroom and the kitchen. ? ?Outdoor [employer] ?Use air conditioning and keep windows closed ?Avoid exposure to decaying vegetation. ?Avoid leaf raking. ?Avoid grain handling. ?Consider wearing a face mask if working in moldy areas. ? ?Indoor Mold Control ?Maintain humidity below 50%. ?Clean washable surfaces with 5% bleach solution. ?Remove sources e.g. Contaminated carpets. ? ? ?Control of Dust Mite Allergen ?Dust mites play a major role in allergic asthma and rhinitis. They occur in environments with high humidity wherever human skin is found. Dust mites absorb humidity from the atmosphere (ie, they do not drink) and feed on organic matter (including shed human and animal skin). Dust mites are a microscopic type of insect that you cannot see with the naked eye. High levels of dust mites have been detected from mattresses, pillows, carpets, upholstered furniture, bed covers, clothes, soft toys and any woven material. The principal allergen of the dust mite is found in its feces. A gram of dust may contain 1,000 mites and 250,000 fecal particles. Mite antigen is easily measured in the air during house cleaning activities. Dust mites do not bite and do not cause harm to humans, other than by triggering allergies/asthma. ? ?Ways to decrease your exposure to dust mites in your home: ? ?1. Encase mattresses, box springs and pillows with a mite-impermeable barrier or cover ? ?2. Wash sheets, blankets and drapes weekly in hot water (130? F) with detergent and dry them in a dryer on the hot setting. ? ?3. Have the  room cleaned frequently with a vacuum cleaner and a damp dust-mop. For carpeting or rugs, vacuuming with a vacuum cleaner equipped with a high-efficiency particulate air (HEPA) filter. The dust mite allergic individual should not be in a room which is being cleaned and should wait 1 hour after cleaning before going into the room. ? ?4. Do not sleep on upholstered furniture (eg, couches). ? ?5. If possible removing carpeting, upholstered furniture and drapery from the home is ideal. Horizontal blinds should be eliminated in the rooms where the person spends the most time (bedroom, study, television room). Washable vinyl, roller-type shades are optimal. ? ?6. Remove all non-washable stuffed toys from the bedroom. Wash stuffed toys weekly like sheets and blankets above. ? ?7. Reduce indoor humidity to less than 50%. Inexpensive humidity monitors can be purchased at most hardware stores. Do not use a humidifier as can make the problem worse and are not recommended. ? ?

## 2021-10-15 ENCOUNTER — Other Ambulatory Visit: Payer: Self-pay

## 2021-10-15 ENCOUNTER — Encounter: Payer: Self-pay | Admitting: Family

## 2021-10-15 ENCOUNTER — Ambulatory Visit (INDEPENDENT_AMBULATORY_CARE_PROVIDER_SITE_OTHER): Payer: 59 | Admitting: Family

## 2021-10-15 VITALS — BP 116/90 | HR 90 | Temp 97.5°F | Resp 18

## 2021-10-15 DIAGNOSIS — J4540 Moderate persistent asthma, uncomplicated: Secondary | ICD-10-CM

## 2021-10-15 DIAGNOSIS — H1013 Acute atopic conjunctivitis, bilateral: Secondary | ICD-10-CM | POA: Diagnosis not present

## 2021-10-15 DIAGNOSIS — R040 Epistaxis: Secondary | ICD-10-CM | POA: Diagnosis not present

## 2021-10-15 DIAGNOSIS — J302 Other seasonal allergic rhinitis: Secondary | ICD-10-CM

## 2021-10-15 DIAGNOSIS — H1010 Acute atopic conjunctivitis, unspecified eye: Secondary | ICD-10-CM

## 2021-10-15 DIAGNOSIS — L209 Atopic dermatitis, unspecified: Secondary | ICD-10-CM

## 2021-10-15 DIAGNOSIS — J309 Allergic rhinitis, unspecified: Secondary | ICD-10-CM

## 2021-10-15 NOTE — Progress Notes (Signed)
? ?[place] [id number] ?Dept: [phone] ? ?FOLLOW UP NOTE ? ?Patient ID: Melanie Rangel, female    DOB: Oct 01, 1980  Age: 41 y.o. MRN: [id number] ?Date of Office Visit: 10/15/2021 ? ?Assessment  ?Chief Complaint: Asthma ? ?HPI ?Melanie Rangel is a 41 year old female who presents today for follow-up of not well controlled moderate persistent asthma, acute cough, seasonal and perennial allergic rhinitis, and seasonal allergic conjunctivitis.  Since her last office visit she denies any new diagnosis or surgeries.  She reports that she did speak with her primary care physician about the syncopal episode that she previously had and had "some things done."  She reports that was told that this was due to [place]. ? ?Moderate persistent asthma is reported as not well controlled with montelukast 10 mg once a day, albuterol as needed, and Flovent 110 mcg 2 puffs once a day without a spacer.  She reports occasional cough that was  dry but feels like it is probably due to drainage.  She also reports some shortness of breath and some nocturnal awakenings due to breathing problems.  She denies wheezing, tightness in her chest, fever, and chills.  Since her last office visit she reports that she was given a steroid injection by her primary care physician when she saw them due to her breathing.  She has not made any trips to the emergency room or urgent care due to breathing problems.  She is using her albuterol inhaler every other day.  She was not using her spacer because she was not sure how to use it.  She also did not realize she was supposed to be using the Flovent 2 puffs twice a day and reports that the prescription was sent for 2 puffs once a day.  Her ACT score today is an 11.  Her chest x-ray from September 10, 2021 shows: "No active cardiopulmonary disease." ? ?Seasonal and perennial allergic rhinitis is reported as not well controlled with allergy injections per protocol and Zyrtec 10 mg once a day.   She did not ever start back on Flonase nasal spray because she continues to see blood clots from her nose all the time, every day.  She would like a referral to ENT.  She reports that she has seen ENT in the past but is not sure who it was.  She also reports nasal congestion, nasal dryness, and postnasal drip.  She has not had any sinus infections since we last saw her.  She denies picking at her nose.  Does feel like her allergy injections help and denies large local reactions.  She reports her EpiPen is up-to-date.  Allergic conjunctivitis is reported as moderately controlled.  She reports itchy watery eyes and does not wear contacts.  She has not been using Pataday for her itchy watery eyes.  Instructed her to let us know if Pataday does not help. ? ?Atopic dermatitis is reported as fine.  She denies any eczematous areas at the time of the visit.  She does report itchy skin at times though due to her eczema.  She continues to use triamcinolone 0.1% cream as needed. ? ?Drug Allergies:  ?Allergies  ?Allergen Reactions  ? Aspirin Swelling  ?  Other reaction(s): Eye Swelling  ? ? ?Review of Systems: ?Review of Systems  ?Constitutional:  Negative for chills and fever.  ?HENT:    ?     Reports seeing blood clots daily from her nose, nasal dryness, and postnasal drip.  ?  Eyes:   ?     Denies itchy watery eyes  ?Respiratory:  Positive for cough and shortness of breath. Negative for wheezing.   ?     Reports occasional cough that was dry but now feels like it is really due to drainage.  Reports some shortness of breath and some nocturnal awakenings due to breathing problems.  Denies wheezing and tightness in chest  ?Cardiovascular:  Negative for chest pain and palpitations.  ?Gastrointestinal:   ?     Denies heartburn or reflux symptoms on omeprazole  ?Genitourinary:  Negative for frequency.  ?Skin:  Positive for itching. Negative for rash.  ?     Reports itchy skin at times due to eczema  ?Neurological:  Positive for  headaches.  ?     Reports increased frequency of headaches since having COVID-19.   she currently takes a medication for her headache  ?Endo/Heme/Allergies:  Positive for environmental allergies.  ? ? ?Physical Exam: ?BP 116/90 (BP Location: Right Arm, Patient Position: Sitting, Cuff Size: Large)   Pulse 90   Temp (!) 97.5 ?F (36.4 ?C) (Temporal)   Resp 18   SpO2 97%   ? ?Physical Exam ?Constitutional:   ?   Appearance: Normal appearance.  ?HENT:  ?   Head: Normocephalic and atraumatic.  ?   Comments: Pharynx normal, Eyes normal, ears normal, nose: Bilateral lower turbinates with scabbing noted ?   Right Ear: Tympanic membrane, ear canal and external ear normal.  ?   Left Ear: Tympanic membrane, ear canal and external ear normal.  ?   Mouth/Throat:  ?   Mouth: Mucous membranes are moist.  ?   Pharynx: Oropharynx is clear.  ?Eyes:  ?   Conjunctiva/sclera: Conjunctivae normal.  ?Cardiovascular:  ?   Rate and Rhythm: Normal rate and regular rhythm.  ?   Heart sounds: Normal heart sounds.  ?Pulmonary:  ?   Effort: Pulmonary effort is normal.  ?   Breath sounds: Normal breath sounds.  ?   Comments: Lungs clear to auscultation ?Musculoskeletal:  ?   Cervical back: Neck supple.  ?Skin: ?   General: Skin is warm.  ?Neurological:  ?   Mental Status: She is alert and oriented to person, place, and time.  ?Psychiatric:     ?   Mood and Affect: Mood normal.     ?   Behavior: Behavior normal.     ?   Thought Content: Thought content normal.     ?   Judgment: Judgment normal.  ? ? ?Diagnostics: ?FVC 1.98 L (66%).  FEV1 1.56 L (63%).  Predicted FVC 3.01 L, predicted FEV1 2.47 L.  Spirometry indicates possible moderate restriction. ? ?Assessment and Plan: ?1. Not well controlled moderate persistent asthma   ?2. Seasonal and perennial allergic rhinitis   ?3. Seasonal allergic conjunctivitis   ?4. Epistaxis   ?5. Atopic dermatitis, unspecified type   ?6. Allergic rhinitis, unspecified seasonality, unspecified trigger   ? ? ?No  orders of the defined types were placed in this encounter. ? ? ?Patient Instructions  ?Asthma ?Continue montelukast 10 mg once a day to prevent cough or wheeze ?Continue albuterol 2 puffs once every 4 hours as needed for cough or wheeze ?Increase Flovent 110 mcg 2 puffs twice a day and start using with spacer to help prevent cough and wheeze.  Proper demonstration given ? ?Allergic rhinitis ?Continue allergen avoidance measures directed toward pollens, pets, mold, and dust mites as listed below ?Continue allergen immunotherapy once  a week and have access to an epinephrine autoinjector set.   ?Continue (Zyrtec) cetirizine 10 mg once a day as needed for runny nose or itch ?Continue to not use (Flonase) fluticasone due to nasal sprays ?Start saline gel to help nasal dryness ?Consider saline nasal rinses as needed for nasal symptoms. Use this before any medicated nasal sprays for best result ? ?Allergic conjunctivitis ?Continue Pataday eye drops one drop in each eye once a day as needed for red, itchy eyes ? ?Atopic dermatitis ?Continue twice a day moisturizing routine ?Continue triamcinolone 0.1% cream twice a day as needed to red/itchy areas. Do not use on face, neck, groin, or armpit region ? ?Epistaxis ?Pinch both nostrils while leaning forward for at least 5 minutes before checking to see if the bleeding has stopped. If bleeding is not controlled within 5-10 minutes apply a cotton ball soaked with oxymetazoline (Afrin) to the bleeding nostril for a few seconds.  ?If the problem persists or worsens a referral to ENT for further evaluation may be necessary. ?We will put in for a referral for ENT.  ? ?Your blood pressure is elevated today while in the office.  Please schedule an appointment with your primary care physician to discuss your elevated blood pressure. ? ?Call the clinic if this treatment plan is not working well for you ? ?Follow up in  2 months or sooner if needed. ? ?Reducing Pollen Exposure ?The  American Academy of Allergy, Asthma and Immunology suggests the following steps to reduce your exposure to pollen during allergy seasons. ?Do not hang sheets or clothing out to dry; pollen may collect on these items. ?

## 2021-10-16 ENCOUNTER — Other Ambulatory Visit: Payer: Self-pay | Admitting: *Deleted

## 2021-10-16 MED ORDER — PROVENTIL HFA 108 (90 BASE) MCG/ACT IN AERS: 2.0000 | INHALATION_SPRAY | RESPIRATORY_TRACT | 1 refills | Status: AC | PRN

## 2021-10-16 NOTE — Telephone Encounter (Signed)
Received fax from [facility] that ventolin hfa is not covered, alternatives are proventil or albuterol hfa. Sent proventil.  ?

## 2021-10-22 ENCOUNTER — Ambulatory Visit (INDEPENDENT_AMBULATORY_CARE_PROVIDER_SITE_OTHER): Payer: 59

## 2021-10-22 DIAGNOSIS — J309 Allergic rhinitis, unspecified: Secondary | ICD-10-CM | POA: Diagnosis not present

## 2021-10-30 ENCOUNTER — Ambulatory Visit (INDEPENDENT_AMBULATORY_CARE_PROVIDER_SITE_OTHER): Payer: 59

## 2021-10-30 DIAGNOSIS — J309 Allergic rhinitis, unspecified: Secondary | ICD-10-CM

## 2021-11-05 ENCOUNTER — Ambulatory Visit (INDEPENDENT_AMBULATORY_CARE_PROVIDER_SITE_OTHER): Payer: 59

## 2021-11-05 ENCOUNTER — Telehealth: Payer: Self-pay | Admitting: Family

## 2021-11-05 DIAGNOSIS — J309 Allergic rhinitis, unspecified: Secondary | ICD-10-CM | POA: Diagnosis not present

## 2021-11-05 NOTE — Telephone Encounter (Signed)
Pt states she has not heard anything on her outgoing referral ?

## 2021-11-05 NOTE — Telephone Encounter (Signed)
According to last office note per Chrissie,  "She would like a referral to ENT. She reports that she has seen ENT in the past but is not sure who it was" this referral will be for epistaxis.   ?

## 2021-11-11 NOTE — Telephone Encounter (Signed)
Left a voicemail & sent a MyChart message for the patient regarding her referral to ENT.  ?

## 2021-11-14 ENCOUNTER — Ambulatory Visit (INDEPENDENT_AMBULATORY_CARE_PROVIDER_SITE_OTHER): Payer: 59

## 2021-11-14 DIAGNOSIS — J309 Allergic rhinitis, unspecified: Secondary | ICD-10-CM | POA: Diagnosis not present

## 2021-11-20 ENCOUNTER — Ambulatory Visit (INDEPENDENT_AMBULATORY_CARE_PROVIDER_SITE_OTHER): Payer: 59

## 2021-11-20 DIAGNOSIS — J309 Allergic rhinitis, unspecified: Secondary | ICD-10-CM | POA: Diagnosis not present

## 2021-11-27 ENCOUNTER — Ambulatory Visit (INDEPENDENT_AMBULATORY_CARE_PROVIDER_SITE_OTHER): Payer: 59

## 2021-11-27 DIAGNOSIS — J309 Allergic rhinitis, unspecified: Secondary | ICD-10-CM | POA: Diagnosis not present

## 2021-12-04 ENCOUNTER — Ambulatory Visit (INDEPENDENT_AMBULATORY_CARE_PROVIDER_SITE_OTHER): Payer: 59

## 2021-12-04 DIAGNOSIS — J309 Allergic rhinitis, unspecified: Secondary | ICD-10-CM | POA: Diagnosis not present

## 2021-12-09 ENCOUNTER — Ambulatory Visit (INDEPENDENT_AMBULATORY_CARE_PROVIDER_SITE_OTHER): Payer: 59

## 2021-12-09 DIAGNOSIS — J309 Allergic rhinitis, unspecified: Secondary | ICD-10-CM

## 2021-12-20 ENCOUNTER — Ambulatory Visit (INDEPENDENT_AMBULATORY_CARE_PROVIDER_SITE_OTHER): Payer: 59

## 2021-12-20 DIAGNOSIS — J309 Allergic rhinitis, unspecified: Secondary | ICD-10-CM | POA: Diagnosis not present

## 2021-12-24 DIAGNOSIS — J3089 Other allergic rhinitis: Secondary | ICD-10-CM

## 2021-12-24 NOTE — Progress Notes (Signed)
VIALS EXP 12-25-22 

## 2021-12-25 ENCOUNTER — Ambulatory Visit (INDEPENDENT_AMBULATORY_CARE_PROVIDER_SITE_OTHER): Payer: 59

## 2021-12-25 DIAGNOSIS — J309 Allergic rhinitis, unspecified: Secondary | ICD-10-CM

## 2022-01-01 ENCOUNTER — Ambulatory Visit (INDEPENDENT_AMBULATORY_CARE_PROVIDER_SITE_OTHER): Payer: 59

## 2022-01-01 DIAGNOSIS — J309 Allergic rhinitis, unspecified: Secondary | ICD-10-CM

## 2022-01-09 ENCOUNTER — Ambulatory Visit (INDEPENDENT_AMBULATORY_CARE_PROVIDER_SITE_OTHER): Payer: 59

## 2022-01-09 DIAGNOSIS — J309 Allergic rhinitis, unspecified: Secondary | ICD-10-CM | POA: Diagnosis not present

## 2022-01-17 ENCOUNTER — Ambulatory Visit (INDEPENDENT_AMBULATORY_CARE_PROVIDER_SITE_OTHER): Payer: 59

## 2022-01-17 DIAGNOSIS — J309 Allergic rhinitis, unspecified: Secondary | ICD-10-CM

## 2022-01-23 DIAGNOSIS — J301 Allergic rhinitis due to pollen: Secondary | ICD-10-CM | POA: Diagnosis not present

## 2022-08-04 IMAGING — MG DIGITAL DIAGNOSTIC BILAT W/ TOMO W/ CAD
8 of 14 series · 8 of 40 positions shown · non-contrast
Comparison: None.

CLINICAL DATA: 39-year-old female with palpable thickening in both
axillary regions. Baseline mammogram.

EXAM:
DIGITAL DIAGNOSTIC BILATERAL MAMMOGRAM WITH CAD AND TOMO
ULTRASOUND LEFT BREAST
ULTRASOUND RIGHT AXILLA

[L MLO synth-2D (1 of 2)]
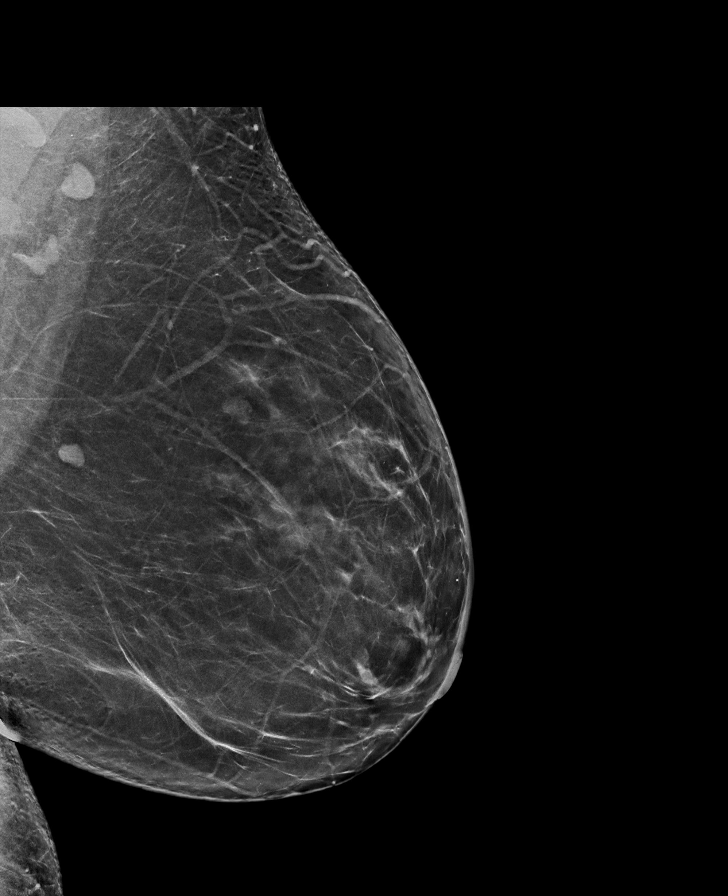

[L MLO synth-2D (2 of 2)]
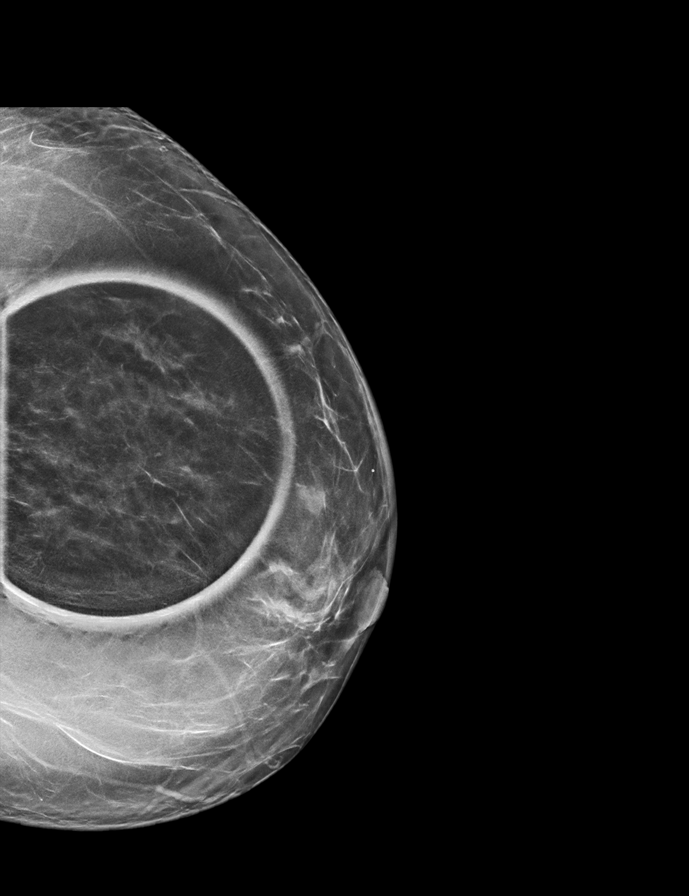

[R MLO synth-2D]
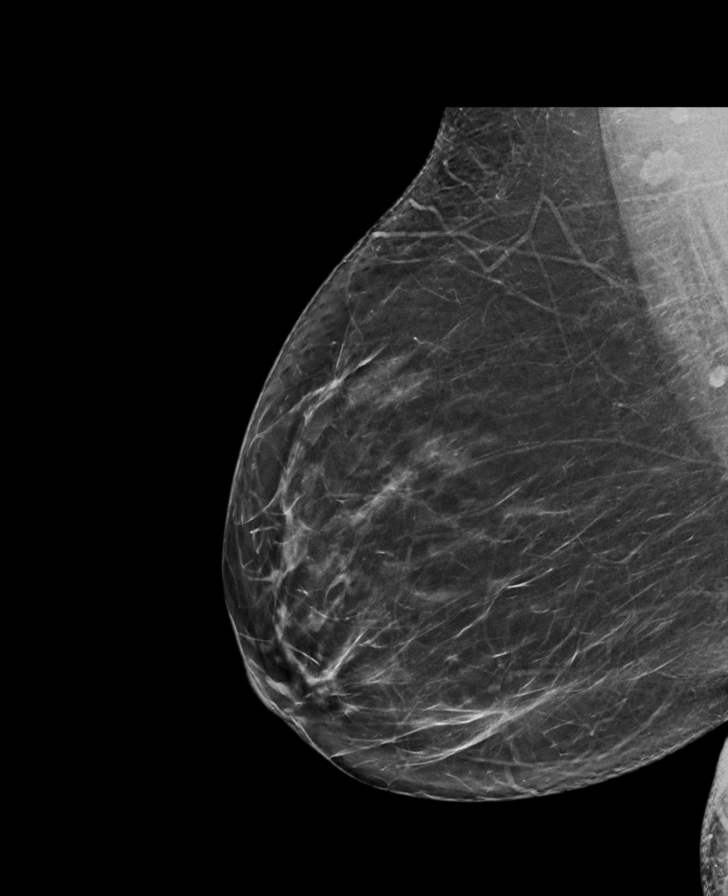

[L CC synth-2D (1 of 2)]
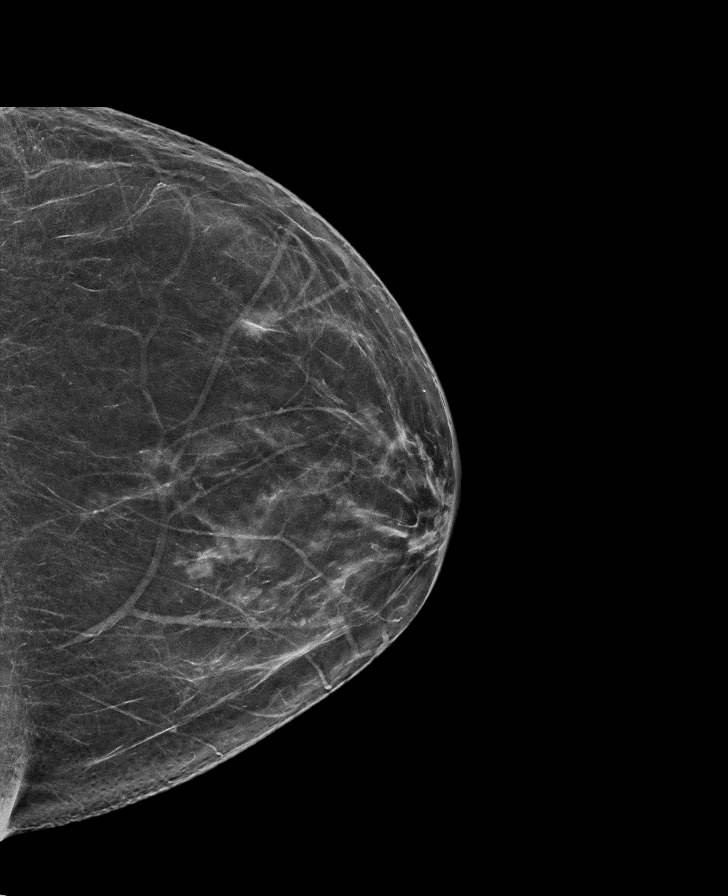

[L ML synth-2D]
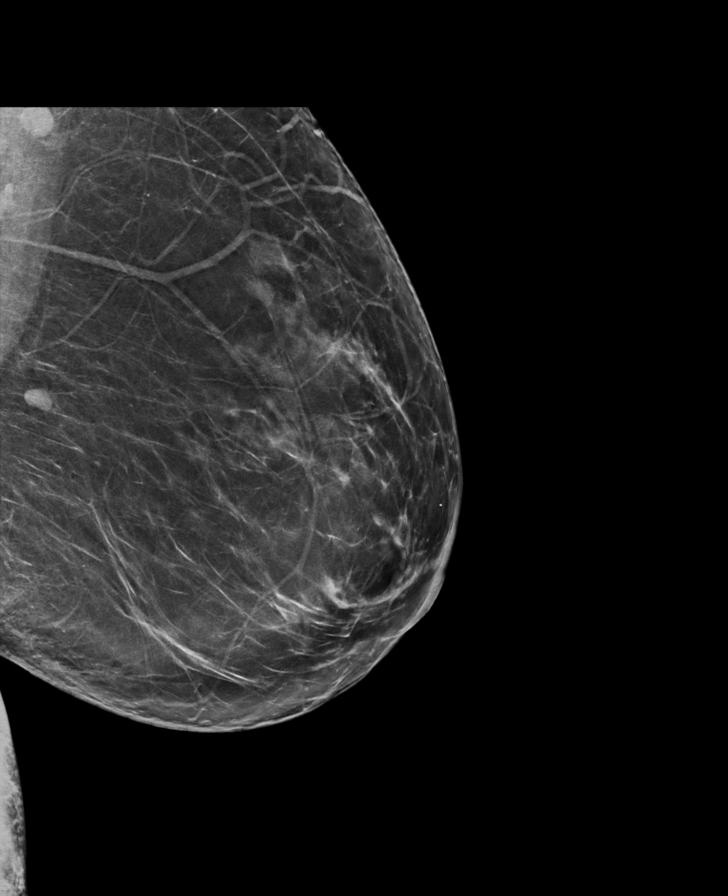

[R CC synth-2D]
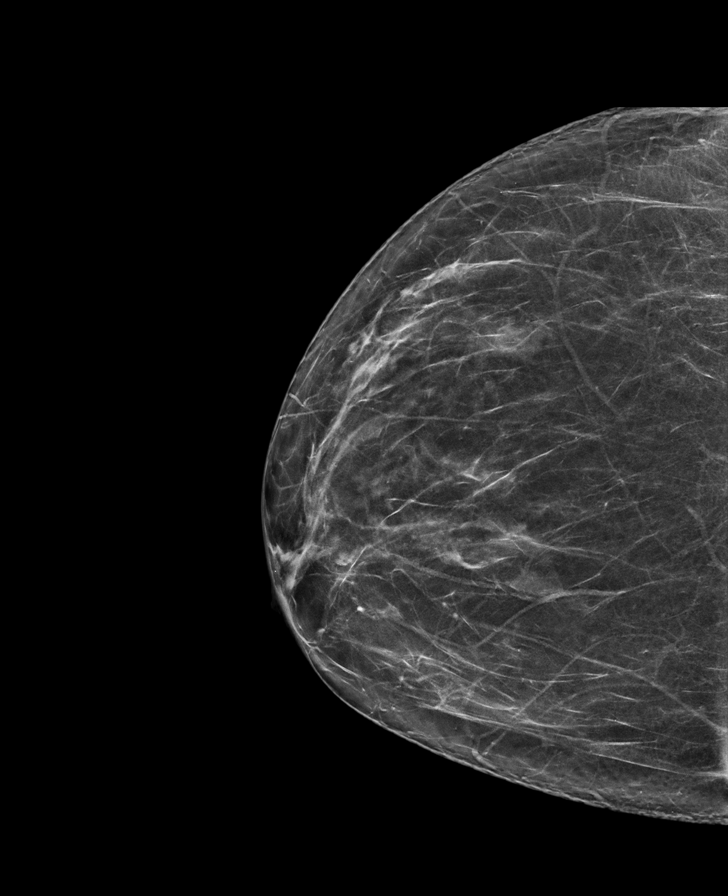

[L CC synth-2D (2 of 2)]
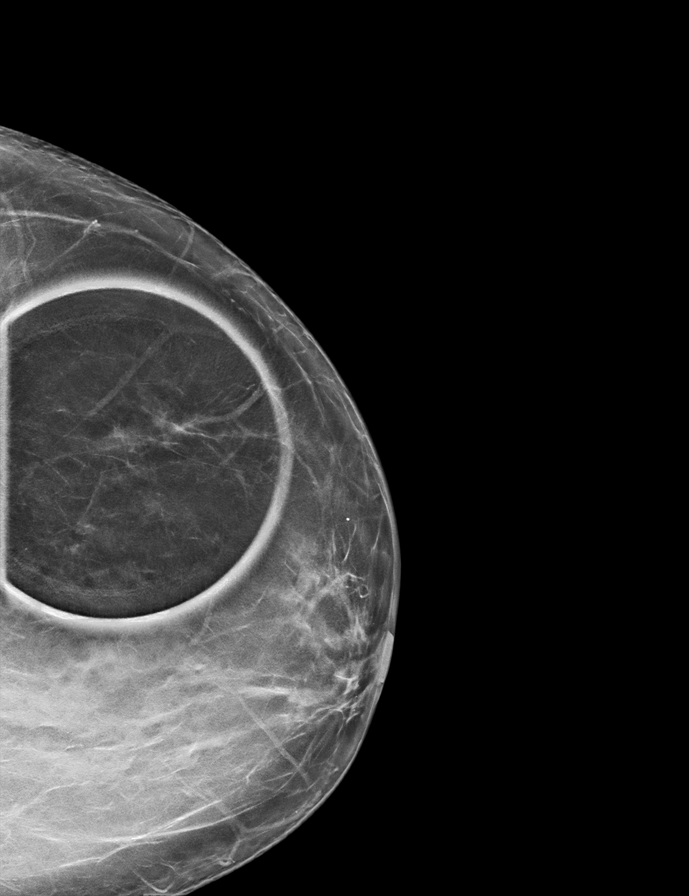

[L MLO tomo · tomo slice 41/81.0]
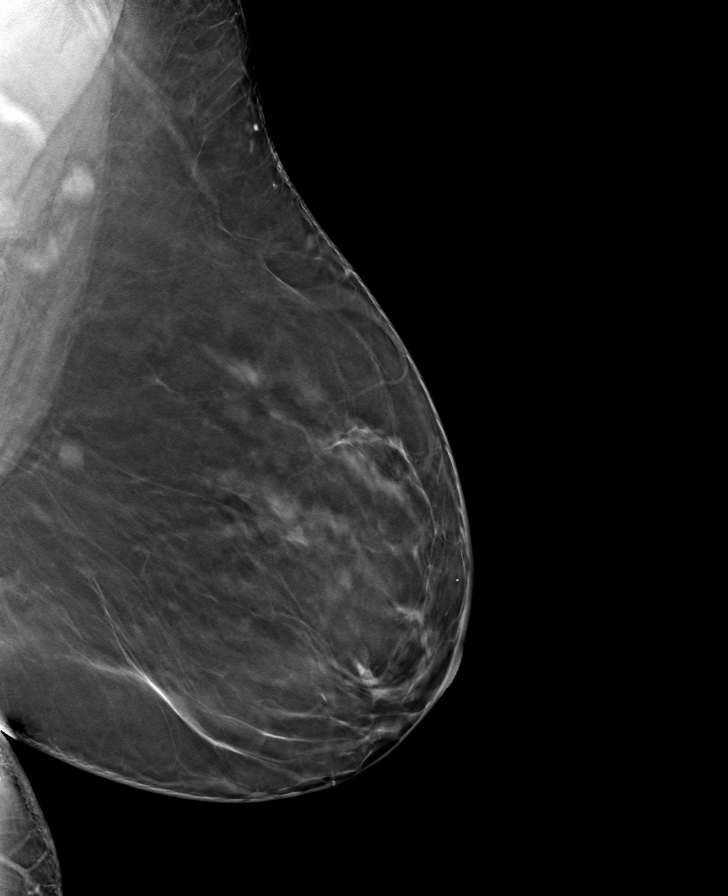

[8 of 40 positions shown; findings below may reference images not displayed]

ACR Breast Density Category b: There are scattered areas of
fibroglandular density.
FINDINGS: 2D/3D full field views of both breasts and spot compression view of
the LEFT breast demonstrate a persistent circumscribed oval mass
within the UPPER INNER LEFT breast.

A possible UPPER OUTER LEFT breast asymmetry disperses on spot
compression views.

No other abnormalities are noted within either breast.

Mammographic images were processed with CAD.

On physical exam, no palpable abnormalities within the axillary
regions.

Targeted ultrasound is performed, showing the following:

A 0.6 x 0.5 x 0.8 cm circumscribed oval hypoechoic mass at the 11
o'clock position of the LEFT breast 7 cm from the nipple,
corresponding to the mammographic mass.

No sonographic abnormalities are identified within the UPPER-OUTER
LEFT breast or within the axillary regions bilaterally.

No abnormal axillary lymph nodes are identified.
IMPRESSION: 1. 0.8 cm UPPER INNER LEFT breast mass, most likely a fibroadenoma.
Options of short-term follow-up and tissue sampling were presented.
The patient desires tissue sampling.
2. No other abnormalities identified within the breasts or axillary
regions.

RECOMMENDATION:
Ultrasound-guided LEFT breast biopsy, which will be scheduled.

I have discussed the findings and recommendations with the patient.
If applicable, a reminder letter will be sent to the patient
regarding the next appointment.

BI-RADS CATEGORY  3: Probably benign.

## 2022-08-04 IMAGING — US US AXILLARY RIGHT
1 series · 5 of 5 positions shown · non-contrast
Comparison: None.

CLINICAL DATA: 39-year-old female with palpable thickening in both
axillary regions. Baseline mammogram.

EXAM:
DIGITAL DIAGNOSTIC BILATERAL MAMMOGRAM WITH CAD AND TOMO
ULTRASOUND LEFT BREAST
ULTRASOUND RIGHT AXILLA

[Series 1: us axillary right · 0.06mm/px · 5 of 5 slices shown]
[im 1/5]
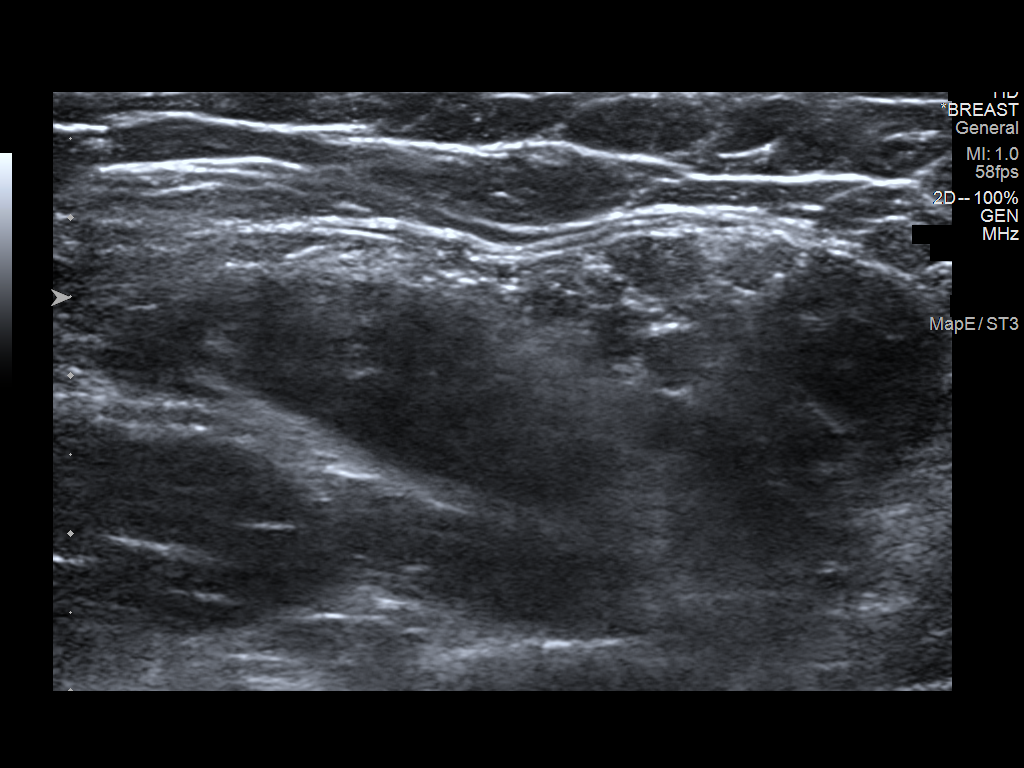
[im 2/5]
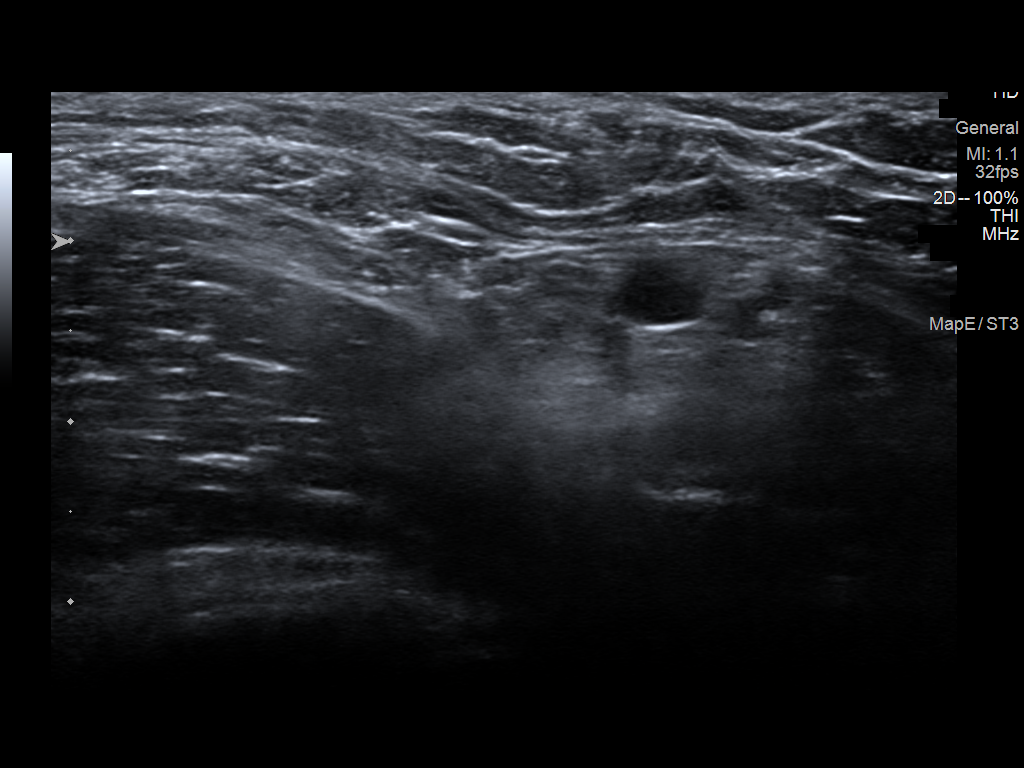
[im 3/5]
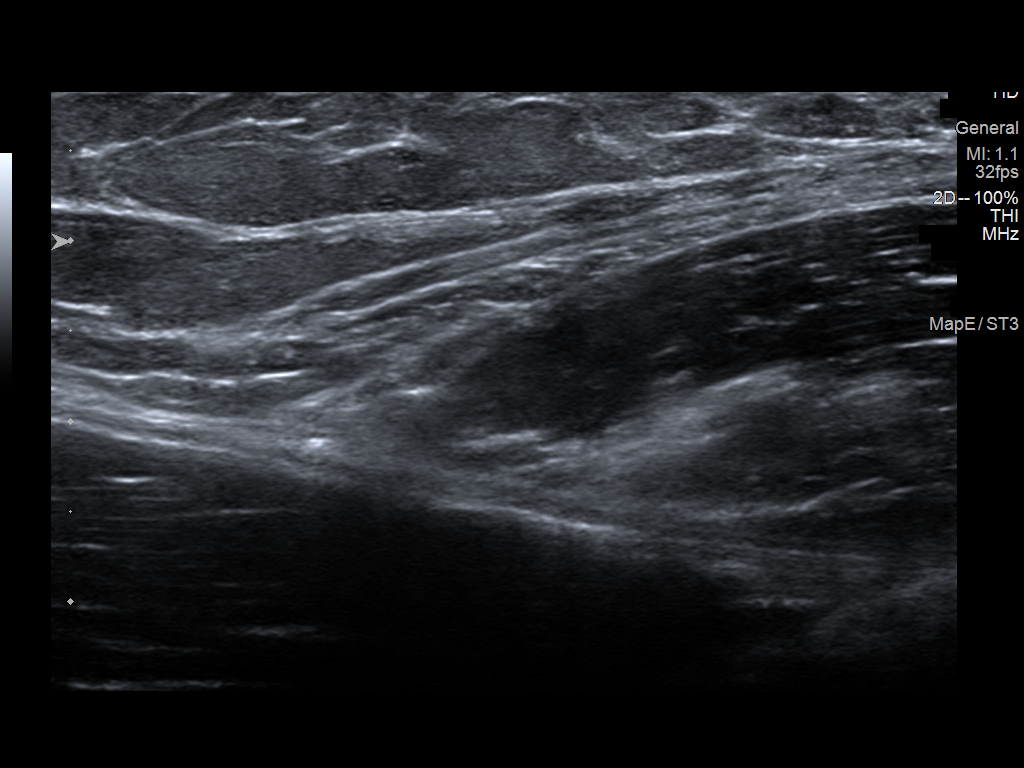
[im 4/5]
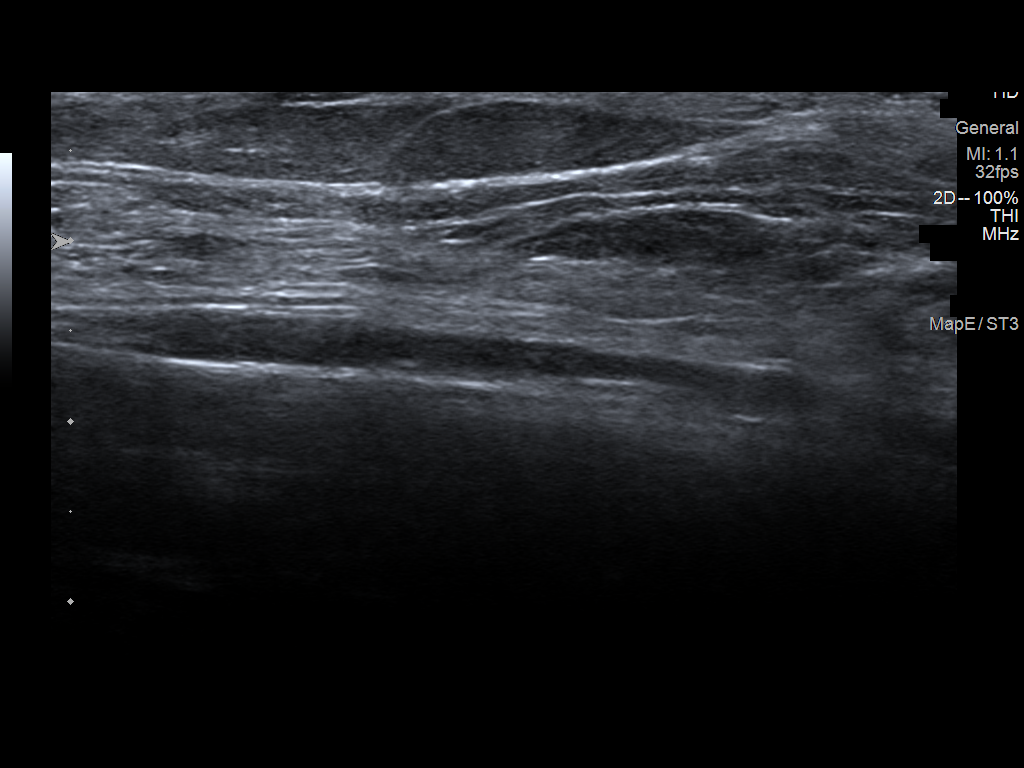
[im 5/5]
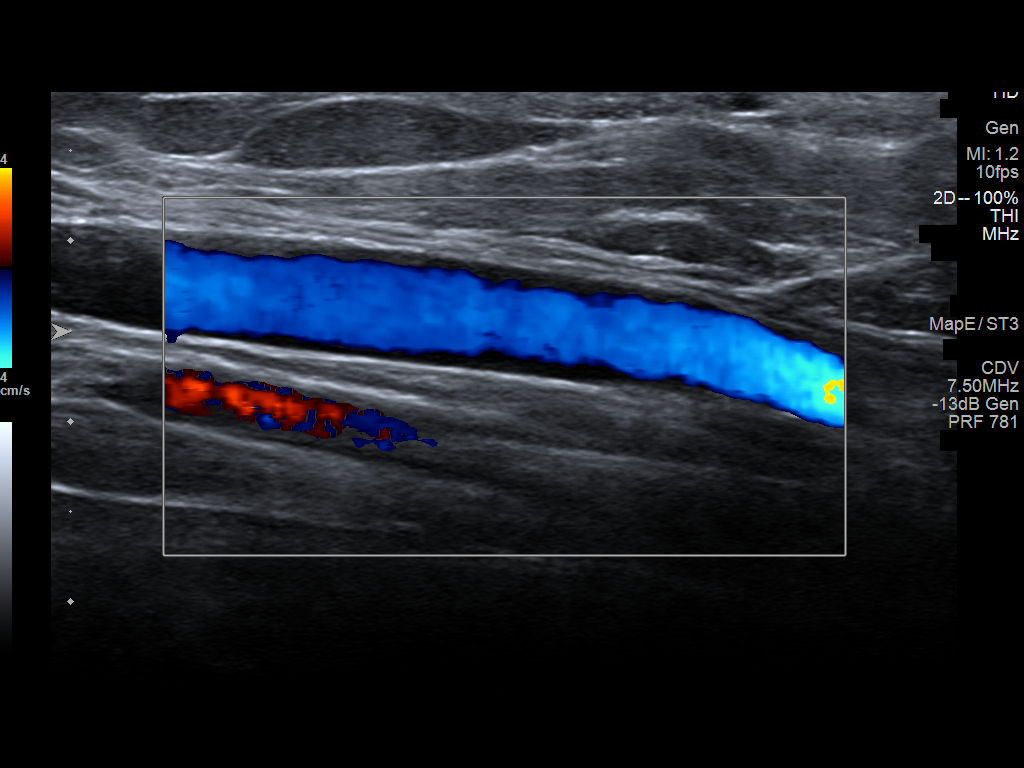

[5 of 5 positions shown; findings below may reference images not displayed]

ACR Breast Density Category b: There are scattered areas of
fibroglandular density.
FINDINGS: 2D/3D full field views of both breasts and spot compression view of
the LEFT breast demonstrate a persistent circumscribed oval mass
within the UPPER INNER LEFT breast.

A possible UPPER OUTER LEFT breast asymmetry disperses on spot
compression views.

No other abnormalities are noted within either breast.

Mammographic images were processed with CAD.

On physical exam, no palpable abnormalities within the axillary
regions.

Targeted ultrasound is performed, showing the following:

A 0.6 x 0.5 x 0.8 cm circumscribed oval hypoechoic mass at the 11
o'clock position of the LEFT breast 7 cm from the nipple,
corresponding to the mammographic mass.

No sonographic abnormalities are identified within the UPPER-OUTER
LEFT breast or within the axillary regions bilaterally.

No abnormal axillary lymph nodes are identified.
IMPRESSION: 1. 0.8 cm UPPER INNER LEFT breast mass, most likely a fibroadenoma.
Options of short-term follow-up and tissue sampling were presented.
The patient desires tissue sampling.
2. No other abnormalities identified within the breasts or axillary
regions.

RECOMMENDATION:
Ultrasound-guided LEFT breast biopsy, which will be scheduled.

I have discussed the findings and recommendations with the patient.
If applicable, a reminder letter will be sent to the patient
regarding the next appointment.

BI-RADS CATEGORY  3: Probably benign.

## 2022-12-11 ENCOUNTER — Encounter: Payer: Self-pay | Admitting: Internal Medicine

## 2022-12-11 ENCOUNTER — Ambulatory Visit (INDEPENDENT_AMBULATORY_CARE_PROVIDER_SITE_OTHER): Payer: BC Managed Care – PPO | Admitting: Internal Medicine

## 2022-12-11 ENCOUNTER — Other Ambulatory Visit: Payer: Self-pay

## 2022-12-11 VITALS — BP 132/76 | HR 105 | Temp 98.5°F | Resp 20 | Ht 63.0 in | Wt 260.4 lb

## 2022-12-11 DIAGNOSIS — J3089 Other allergic rhinitis: Secondary | ICD-10-CM | POA: Diagnosis not present

## 2022-12-11 DIAGNOSIS — H1013 Acute atopic conjunctivitis, bilateral: Secondary | ICD-10-CM

## 2022-12-11 DIAGNOSIS — J302 Other seasonal allergic rhinitis: Secondary | ICD-10-CM

## 2022-12-11 DIAGNOSIS — J4531 Mild persistent asthma with (acute) exacerbation: Secondary | ICD-10-CM

## 2022-12-11 DIAGNOSIS — H1010 Acute atopic conjunctivitis, unspecified eye: Secondary | ICD-10-CM

## 2022-12-11 MED ORDER — MONTELUKAST SODIUM 10 MG PO TABS: 10.0000 mg | ORAL_TABLET | Freq: Every day | ORAL | 3 refills | Status: DC

## 2022-12-11 MED ORDER — PREDNISONE 20 MG PO TABS: ORAL_TABLET | ORAL | 0 refills | Status: DC

## 2022-12-11 MED ORDER — ASMANEX (120 METERED DOSES) 220 MCG/ACT IN AEPB: 2.0000 | INHALATION_SPRAY | Freq: Two times a day (BID) | RESPIRATORY_TRACT | 3 refills | Status: DC

## 2022-12-11 MED ORDER — METHYLPREDNISOLONE ACETATE 40 MG/ML IJ SUSP
40.0000 mg | Freq: Once | INTRAMUSCULAR | Status: AC
  Administered 2022-12-11: 40 mg via INTRAMUSCULAR

## 2022-12-11 MED ORDER — EPINEPHRINE 0.3 MG/0.3ML IJ SOAJ: INTRAMUSCULAR | 3 refills | Status: AC

## 2022-12-11 MED ORDER — OLOPATADINE HCL 0.2 % OP SOLN: 1.0000 [drp] | Freq: Every day | OPHTHALMIC | 5 refills | Status: AC | PRN

## 2022-12-11 MED ORDER — ALBUTEROL SULFATE HFA 108 (90 BASE) MCG/ACT IN AERS: 2.0000 | INHALATION_SPRAY | RESPIRATORY_TRACT | 2 refills | Status: DC | PRN

## 2022-12-11 MED ORDER — TRIAMCINOLONE ACETONIDE 0.1 % EX CREA: TOPICAL_CREAM | CUTANEOUS | 1 refills | Status: AC

## 2022-12-11 NOTE — Progress Notes (Signed)
FOLLOW UP Date of Service/Encounter:  12/11/22   Subjective:  Melanie Rangel (DOB: October 28, 1980) is a 42 y.o. female [facility] in re-evaluation of the following:  moderate persistent asthma, acute cough, seasonal and perennial allergic rhinitis, and seasonal allergic conjunctivitis  History obtained from: chart review and patient.  For Review, LV was on 10/15/21  with Nehemiah Settle, FNP seen for routine follow-up. This is my first encounter with this patient. See below for summary of history and diagnostics.  Therapeutic plans/changes recommended: asthma not controlled, not using flovent 110 mcg 2 puffs BID (only daily)-encouraged to increase to twice daily. FEV1 63% On AIT from 2019   Today presents for follow-up. She lives in [place] now. Getting allergy injections in [place]. however she is home currently helping take care of her mother and what was supposed to be a short stay has now turned into a longer than predicted stay. She is getting her vials sent back to [place] so that we can give her injections while here. In the past was on injfections through our clinic. Now she is getting injections through [place] allergist using their mix and vials. She is in maintenance, coming monthly, last April 25 th. Her allergies were awful when she lived here and now  seemed to have returned. Eyes watering, congestion, sinus headaches.  Doesn't feel well. She does have dog here and she is allergic to dog. Current medication routines: cetirizine 10 mg daily and AIT.  Not using nasal sprays because it irritated her nose.  Not using eye drops. She left her inhalers in [place].  She uses her rescue inhaler about once every two weeks. 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  She did have COVID in [facility]. She did get prednisone for that.    Allergies as of 12/11/2022       Reactions   Aspirin Swelling   Other reaction(s): Eye Swelling         Medication List        Accurate as of Dec 11, 2022  1:01 PM. If you have any questions, ask your nurse or doctor.          Aimovig 140 MG/ML Soaj Generic drug: Erenumab-aooe Inject into the skin.   amLODipine 5 MG tablet Commonly known as: NORVASC Take 5 mg by mouth daily.   azelastine 0.05 % ophthalmic solution Commonly known as: OPTIVAR 1 drop.   azelastine 0.1 % nasal spray Commonly known as: ASTELIN 2 SPRAY INTRANASALLY EVERY 12 HOURS AS NEEDED FOR POST NASAL DRIP AND RUNNY NOSE   cetirizine 10 MG tablet Commonly known as: ZYRTEC Take 1 tablet (10 mg total) by mouth daily.   clotrimazole 1 % cream Commonly known as: Lotrimin AF Apply topical for 2 weeks   EPINEPHrine 0.3 mg/0.3 mL Soaj injection Commonly known as: Auvi-Q Use as directed for severe allergic reactions   escitalopram 10 MG tablet Commonly known as: LEXAPRO Take 10 mg by mouth daily.   estradiol 0.1 MG/GM vaginal cream Commonly known as: ESTRACE Place vaginally.   fluticasone 110 MCG/ACT inhaler Commonly known as: Flovent HFA Inhale 2 puffs into the lungs 2 (two) times daily. What changed: when to take this   fluticasone 50 MCG/ACT nasal spray Commonly known as: FLONASE Administer 2 sprays into each nostril in the morning. Shake gently. Before first use, prime pump. After use, clean tip and replace cap.   Hydrocortisone (Perianal)  1 % Crea Apply topically at bedtime.   lisinopril 5 MG tablet Commonly known as: ZESTRIL Take 5 mg by mouth daily.   metFORMIN 500 MG tablet Commonly known as: GLUCOPHAGE Take by mouth.   metoprolol succinate 25 MG 24 hr tablet Commonly known as: TOPROL-XL Take 25 mg by mouth daily.   misoprostol 200 MCG tablet Commonly known as: CYTOTEC Place 200 mcg vaginally once.   montelukast 10 MG tablet Commonly known as: SINGULAIR Take by mouth.   Olopatadine HCl 0.2 % Soln Place 1 drop in each eye once a day as needed for itchy watery eyes    omeprazole 20 MG capsule Commonly known as: PRILOSEC Take by mouth.   Proventil HFA 108 (90 Base) MCG/ACT inhaler Generic drug: albuterol Inhale 2 puffs into the lungs every 4 (four) hours as needed for wheezing or shortness of breath.   rizatriptan 10 MG tablet Commonly known as: MAXALT Take by mouth.   rosuvastatin 5 MG tablet Commonly known as: CRESTOR Take by mouth.   SUMAtriptan 100 MG tablet Commonly known as: IMITREX Take by mouth.   triamcinolone cream 0.1 % Commonly known as: KENALOG Apply to stubborn red itchy areas below your face up to twice a day as needed   Vitamin D (Ergocalciferol) 1.25 MG (50000 UNIT) Caps capsule Commonly known as: DRISDOL Take 50,000 Units by mouth once a week.   Vitamin D3 50 MCG (2000 UT) capsule Take 2,000 Units by mouth daily.       Past Medical History:  Diagnosis Date   Asthmatic bronchitis 01/06/2018   Eczema    as a child   Migraine    Recurrent upper respiratory infection (URI)    Urticaria    as a child   Past Surgical History:  Procedure Laterality Date   ADENOIDECTOMY     APPENDECTOMY  2012   BREAST BIOPSY Left 06/22/2020   Fibroadenoma   TONSILLECTOMY     Otherwise, there have been no changes to her past medical history, surgical history, family history, or social history.  ROS: All others negative except as noted per HPI.   Objective:  BP 132/76 (BP Location: Left Arm, Patient Position: Sitting, Cuff Size: Large)   Pulse (!) 105   Temp 98.5 F (36.9 C) (Temporal)   Resp 20   Ht 5\' 3"  (1.6 m)   Wt 260 lb 6.4 oz (118.1 kg)   SpO2 97%   BMI 46.13 kg/m  Body mass index is 46.13 kg/m. Physical Exam: General Appearance:  Alert, cooperative, no distress, appears stated age  Head:  Normocephalic, without obvious abnormality, atraumatic  Eyes:  Conjunctiva clear, EOM's intact  Nose: Nares normal, hypertrophic turbinates, normal mucosa, and no visible anterior polyps  Throat: Lips, tongue normal; teeth  and gums normal, normal posterior oropharynx  Neck: Supple, symmetrical  Lungs:   clear to auscultation bilaterally, Respirations unlabored, no coughing  Heart:  regular rate and rhythm and no murmur, Appears well perfused  Extremities: No edema  Skin: Skin color, texture, turgor normal and no rashes or lesions on visualized portions of skin  Neurologic: No gross deficits  Spirometry:  Tracings reviewed. Her effort: Good reproducible efforts. FVC: 2.09L FEV1: 1.76L, 72% predicted FEV1/FVC ratio: 0.84 Interpretation: Spirometry consistent with possible restrictive disease.  Please see scanned spirometry results for details.   Assessment/Plan   Asthma with exacerbation:  - 40 mg IM depo in clinic today, tomorrow start 40 mg prednisone daily for 4 additional days Continue montelukast 10 mg  once a day to prevent cough or wheeze Continue albuterol 2 puffs once every 4 hours as needed for cough or wheeze Start Asmanex 2 puffs twice a day  to help prevent cough and wheeze.   Allergic rhinitis with significant flare Continue allergen avoidance measures directed toward pollens, pets, mold, and dust mites as listed below We will continue allergy injections once we receive them from your allergist. Continue (Zyrtec) cetirizine 10 mg OR Xyzal (levocetrizine) 5 mgonce a day as needed for runny nose or itch Consider saline nasal rinses as needed for nasal symptoms. Use this before any medicated nasal sprays for best result  Allergic conjunctivitis Continue Pataday eye drops one drop in each eye once a day as needed for red, itchy eyes  Atopic dermatitis Continue twice a day moisturizing routine Continue triamcinolone 0.1% cream twice a day as needed to red/itchy areas. Do not use on face, neck, groin, or armpit region  Call the clinic if this treatment plan is not working well for you  Follow up in 6 months or sooner if needed if still here in [place].  Tonny Bollman, MD  Allergy and Ast[facility]  [facility]

## 2022-12-11 NOTE — Patient Instructions (Addendum)
Asthma with exacerbation:  - 40 mg IM depo in clinic today, tomorrow start 40 mg prednisone daily for 4 additional days Continue montelukast 10 mg once a day to prevent cough or wheeze Continue albuterol 2 puffs once every 4 hours as needed for cough or wheeze Start Asmanex 2 puffs twice a day  to help prevent cough and wheeze.   Allergic rhinitis with significant flare Continue allergen avoidance measures directed toward pollens, pets, mold, and dust mites as listed below We will continue allergy injections once we receive them from your allergist. Continue (Zyrtec) cetirizine 10 mg OR Xyzal (levocetrizine) 5 mgonce a day as needed for runny nose or itch Consider saline nasal rinses as needed for nasal symptoms. Use this before any medicated nasal sprays for best result  Allergic conjunctivitis Continue Pataday eye drops one drop in each eye once a day as needed for red, itchy eyes  Atopic dermatitis Continue twice a day moisturizing routine Continue triamcinolone 0.1% cream twice a day as needed to red/itchy areas. Do not use on face, neck, groin, or armpit region  Call the clinic if this treatment plan is not working well for you  Follow up in 6 months or sooner if needed if still here in [place].  Reducing Pollen Exposure The American Academy of Allergy, Asthma and Immunology suggests the following steps to reduce your exposure to pollen during allergy seasons. Do not hang sheets or clothing out to dry; pollen may collect on these items. Do not mow lawns or spend time around freshly cut grass; mowing stirs up pollen. Keep windows closed at night.  Keep car windows closed while driving. Minimize morning activities outdoors, a time when pollen counts are usually at their highest. Stay indoors as much as possible when pollen counts or humidity is high and on windy days when pollen tends to remain in the air longer. Use air conditioning when possible.  Many air conditioners have filters that  trap the pollen spores. Use a HEPA room air filter to remove pollen form the indoor air you breathe.  Control of Dog or Cat Allergen Avoidance is the best way to manage a dog or cat allergy. If you have a dog or cat and are allergic to dog or cats, consider removing the dog or cat from the home. If you have a dog or cat but don't want to find it a new home, or if your family wants a pet even though someone in the household is allergic, here are some strategies that may help keep symptoms at bay:  Keep the pet out of your bedroom and restrict it to only a few rooms. Be advised that keeping the dog or cat in only one room will not limit the allergens to that room. Don't pet, hug or kiss the dog or cat; if you do, wash your hands with soap and water. High-efficiency particulate air (HEPA) cleaners run continuously in a bedroom or living room can reduce allergen levels over time. Regular use of a high-efficiency vacuum cleaner or a central vacuum can reduce allergen levels. Giving your dog or cat a bath at least once a week can reduce airborne allergen.  Control of Mold Allergen Mold and fungi can grow on a variety of surfaces provided certain temperature and moisture conditions exist.  Outdoor molds grow on plants, decaying vegetation and soil.  The major outdoor mold, Alternaria and Cladosporium, are found in very high numbers during hot and dry conditions.  Generally, a late Summer -  Fall peak is seen for common outdoor fungal spores.  Rain will temporarily lower outdoor mold spore count, but counts rise rapidly when the rainy period ends.  The most important indoor molds are Aspergillus and Penicillium.  Dark, humid and poorly ventilated basements are ideal sites for mold growth.  The next most common sites of mold growth are the bathroom and the kitchen.  Outdoor [employer] Use air conditioning and keep windows closed Avoid exposure to decaying vegetation. Avoid leaf raking. Avoid grain  handling. Consider wearing a face mask if working in moldy areas.  Indoor Mold Control Maintain humidity below 50%. Clean washable surfaces with 5% bleach solution. Remove sources e.g. Contaminated carpets.   Control of Dust Mite Allergen Dust mites play a major role in allergic asthma and rhinitis. They occur in environments with high humidity wherever human skin is found. Dust mites absorb humidity from the atmosphere (ie, they do not drink) and feed on organic matter (including shed human and animal skin). Dust mites are a microscopic type of insect that you cannot see with the naked eye. High levels of dust mites have been detected from mattresses, pillows, carpets, upholstered furniture, bed covers, clothes, soft toys and any woven material. The principal allergen of the dust mite is found in its feces. A gram of dust may contain 1,000 mites and 250,000 fecal particles. Mite antigen is easily measured in the air during house cleaning activities. Dust mites do not bite and do not cause harm to humans, other than by triggering allergies/asthma.  Ways to decrease your exposure to dust mites in your home:  1. Encase mattresses, box springs and pillows with a mite-impermeable barrier or cover  2. Wash sheets, blankets and drapes weekly in hot water (130 F) with detergent and dry them in a dryer on the hot setting.  3. Have the room cleaned frequently with a vacuum cleaner and a damp dust-mop. For carpeting or rugs, vacuuming with a vacuum cleaner equipped with a high-efficiency particulate air (HEPA) filter. The dust mite allergic individual should not be in a room which is being cleaned and should wait 1 hour after cleaning before going into the room.  4. Do not sleep on upholstered furniture (eg, couches).  5. If possible removing carpeting, upholstered furniture and drapery from the home is ideal. Horizontal blinds should be eliminated in the rooms where the person spends the most time  (bedroom, study, television room). Washable vinyl, roller-type shades are optimal.  6. Remove all non-washable stuffed toys from the bedroom. Wash stuffed toys weekly like sheets and blankets above.  7. Reduce indoor humidity to less than 50%. Inexpensive humidity monitors can be purchased at most hardware stores. Do not use a humidifier as can make the problem worse and are not recommended.

## 2022-12-18 ENCOUNTER — Ambulatory Visit: Payer: Self-pay

## 2022-12-19 ENCOUNTER — Ambulatory Visit (INDEPENDENT_AMBULATORY_CARE_PROVIDER_SITE_OTHER): Payer: BC Managed Care – PPO

## 2022-12-19 DIAGNOSIS — J309 Allergic rhinitis, unspecified: Secondary | ICD-10-CM | POA: Diagnosis not present

## 2022-12-19 NOTE — Progress Notes (Signed)
Patient bringing vials from [facility] [address] [place], [place] [address] # [phone] fax# [phone]. She will get injections here until September.

## 2023-01-21 ENCOUNTER — Ambulatory Visit (INDEPENDENT_AMBULATORY_CARE_PROVIDER_SITE_OTHER): Payer: BC Managed Care – PPO

## 2023-01-21 DIAGNOSIS — J309 Allergic rhinitis, unspecified: Secondary | ICD-10-CM | POA: Diagnosis not present

## 2023-02-12 ENCOUNTER — Ambulatory Visit: Payer: BC Managed Care – PPO | Admitting: Internal Medicine

## 2023-02-12 NOTE — Progress Notes (Signed)
FOLLOW UP Date of Service/Encounter:  02/13/23  Subjective:  Melanie Rangel (DOB: Sep 18, 1980) is a 42 y.o. female who [facility] in re-evaluation of the following:  moderate persistent asthma, acute cough, seasonal and perennial allergic rhinitis, and seasonal allergic conjunctivitis - here for acute visit due to allergy flare History obtained from: chart review and patient.  For Review, LV was on 12/11/22  with Dr.Maree Ainley seen for routine follow-up. See below for summary of history and diagnostics.   Therapeutic plans/changes recommended: IM methylpred for asthma flare Start: asmanex Continue: montelukast, zyrtec, AIT ----------------------------------------------------- Pertinent History/Diagnostics:  Asthma: Rescue inhaler a few times per month, prednisone x 2 in 2024 due to exacerbations. Current meds: asmanex, montelukast Allergic Rhinitis:  Lives in [place], previously from [place].  Comes home to take care of her mother and stays months at a time.  In maintenance from her [place] allergist. She is receiving her injections in our office while in [place] helping her mother.  Dog in the home. Current meds: Cetirizine 10 mg daily, AIT. Tried: Nasal sprays, stopped due to nose irritation. Eczema: Uses triamcinolone PRN Other: migraines --------------------------------------------------- Today presents for follow-up. Her allergies and asthma are getting worse. She is coughing up phlegm. Coughing non-stop. Spending lots of time with dog who makes her allergies/asthma flare. She is also very affected by the pollen. She is using her rescue inhaler about once per month. She never picked up the asmanex that was sent in for her. She does feel short of breath and congested. She is blowing her nose, and her ears feel clogged. She is doing her allergy injections and is due for one today. Tolerating injections well. She is taking montelukast and zyrtec  daily and claritin nightly.  She is using her allergy eye drops. Eczema is doing great. No antiboitics or steroids since last visit.    All medications reviewed by clinical staff and updated in chart. No new pertinent medical or surgical history except as noted in HPI.  ROS: All others negative except as noted per HPI.   Objective:  BP 118/82   Pulse (!) 102   Temp 97.7 F (36.5 C) (Temporal)   Wt 259 lb 4.8 oz (117.6 kg)   SpO2 98%   BMI 45.93 kg/m  Body mass index is 45.93 kg/m. Physical Exam: General Appearance:  Alert, cooperative, no distress, appears stated age  Head:  Normocephalic, without obvious abnormality, atraumatic  Eyes:  Conjunctiva clear, EOM's intact  Ears EACs normal bilaterally and normal TMs bilaterally  Nose: Nares normal, hypertrophic turbinates, normal mucosa, and no visible anterior polyps  Throat: Lips, tongue normal; teeth and gums normal, normal posterior oropharynx  Neck: Supple, symmetrical  Lungs:   clear to auscultation bilaterally, Respirations unlabored, intermittent dry coughing  Heart:  regular rate and rhythm and no murmur, Appears well perfused  Extremities: No edema  Skin: Skin color, texture, turgor normal and no rashes or lesions on visualized portions of skin  Neurologic: No gross deficits   Labs:  Lab Orders  No laboratory test(s) ordered today    Spirometry:  Tracings reviewed. Her effort: Good reproducible efforts. FVC: 2.56L FEV1: 1.74L, 71% predicted FEV1/FVC ratio: 0.68% Interpretation: Spirometry consistent with mild obstructive disease.  Please see scanned spirometry results for details.  Assessment/Plan   Asthma with exacerbation:  - 40 mg IM depo in clinic today, tomorrow start 40 mg prednisone daily for 4 additional days Continue montelukast 10 mg once a day  to prevent cough or wheeze Continue Airsupra 2 puffs once every 4 hours as needed for cough or wheeze. Can ues up to a maximum of 12 puffs/day. Start  Symbicort 80 mcg 2 puffs twice daily - rinse mouth after use. Use everyday.  Allergic rhinitis with significant flare Continue allergen avoidance measures directed toward pollens, pets, mold, and dust mites as listed below We will continue allergy injections - NONE today, come back next week when feeling better. Continue (Zyrtec) cetirizine 10 mg OR Xyzal (levocetrizine) 5 mg or Claritin (loratadine) 10 mg once a day as needed for runny nose or itch. Can use TWICE daily for breakthrough symptoms. Consider saline nasal rinses as needed for nasal symptoms. Use this before any medicated nasal sprays for best result  Allergic conjunctivitis-controlled Continue Pataday eye drops one drop in each eye once a day as needed for red, itchy eyes  Atopic dermatitis-controlled Continue twice a day moisturizing routine Continue triamcinolone 0.1% cream twice a day as needed to red/itchy areas. Do not use on face, neck, groin, or armpit region  Call the clinic if this treatment plan is not working well for you  Follow up in 3 months or sooner if needed if still here in [place]. It was a pleasure seeing you in clinic.  Other:  none  Tonny Bollman, MD  [facility]

## 2023-02-13 ENCOUNTER — Encounter: Payer: Self-pay | Admitting: Internal Medicine

## 2023-02-13 ENCOUNTER — Ambulatory Visit: Payer: BC Managed Care – PPO | Admitting: Internal Medicine

## 2023-02-13 ENCOUNTER — Other Ambulatory Visit: Payer: Self-pay

## 2023-02-13 VITALS — BP 118/82 | HR 102 | Temp 97.7°F | Wt 259.3 lb

## 2023-02-13 DIAGNOSIS — J3089 Other allergic rhinitis: Secondary | ICD-10-CM

## 2023-02-13 DIAGNOSIS — J4531 Mild persistent asthma with (acute) exacerbation: Secondary | ICD-10-CM

## 2023-02-13 DIAGNOSIS — L2089 Other atopic dermatitis: Secondary | ICD-10-CM | POA: Diagnosis not present

## 2023-02-13 DIAGNOSIS — H1013 Acute atopic conjunctivitis, bilateral: Secondary | ICD-10-CM | POA: Diagnosis not present

## 2023-02-13 DIAGNOSIS — H1010 Acute atopic conjunctivitis, unspecified eye: Secondary | ICD-10-CM

## 2023-02-13 DIAGNOSIS — J302 Other seasonal allergic rhinitis: Secondary | ICD-10-CM

## 2023-02-13 MED ORDER — AIRSUPRA 90-80 MCG/ACT IN AERO: 2.0000 | INHALATION_SPRAY | RESPIRATORY_TRACT | 2 refills | Status: AC | PRN

## 2023-02-13 MED ORDER — BUDESONIDE-FORMOTEROL FUMARATE 80-4.5 MCG/ACT IN AERO: 2.0000 | INHALATION_SPRAY | Freq: Two times a day (BID) | RESPIRATORY_TRACT | 5 refills | Status: AC

## 2023-02-13 MED ORDER — MONTELUKAST SODIUM 10 MG PO TABS: 10.0000 mg | ORAL_TABLET | Freq: Every day | ORAL | 3 refills | Status: AC

## 2023-02-13 MED ORDER — METHYLPREDNISOLONE ACETATE 40 MG/ML IJ SUSP
40.0000 mg | Freq: Once | INTRAMUSCULAR | Status: AC
  Administered 2023-02-13: 40 mg via INTRAMUSCULAR

## 2023-02-13 MED ORDER — PREDNISONE 20 MG PO TABS: ORAL_TABLET | ORAL | 0 refills | Status: AC

## 2023-02-13 NOTE — Patient Instructions (Addendum)
Asthma with exacerbation:  - 40 mg IM depo in clinic today, tomorrow start 40 mg prednisone daily for 4 additional days Continue montelukast 10 mg once a day to prevent cough or wheeze Continue Airsupra 2 puffs once every 4 hours as needed for cough or wheeze. Can ues up to a maximum of 12 puffs/day. Start Symbicort 80 mcg 2 puffs twice daily - rinse mouth after use. Use everyday.  Allergic rhinitis with significant flare Continue allergen avoidance measures directed toward pollens, pets, mold, and dust mites as listed below We will continue allergy injections - NONE today, come back next week when feeling better. Continue (Zyrtec) cetirizine 10 mg OR Xyzal (levocetrizine) 5 mg or Claritin (loratadine) 10 mg once a day as needed for runny nose or itch. Can use TWICE daily for breakthrough symptoms. Consider saline nasal rinses as needed for nasal symptoms. Use this before any medicated nasal sprays for best result  Allergic conjunctivitis Continue Pataday eye drops one drop in each eye once a day as needed for red, itchy eyes  Atopic dermatitis Continue twice a day moisturizing routine Continue triamcinolone 0.1% cream twice a day as needed to red/itchy areas. Do not use on face, neck, groin, or armpit region  Call the clinic if this treatment plan is not working well for you  Follow up in 3 months or sooner if needed if still here in [place]. It was a pleasure seeing you in clinic.  Reducing Pollen Exposure The American Academy of Allergy, Asthma and Immunology suggests the following steps to reduce your exposure to pollen during allergy seasons. Do not hang sheets or clothing out to dry; pollen may collect on these items. Do not mow lawns or spend time around freshly cut grass; mowing stirs up pollen. Keep windows closed at night.  Keep car windows closed while driving. Minimize morning activities outdoors, a time when pollen counts are usually at their highest. Stay indoors as much as  possible when pollen counts or humidity is high and on windy days when pollen tends to remain in the air longer. Use air conditioning when possible.  Many air conditioners have filters that trap the pollen spores. Use a HEPA room air filter to remove pollen form the indoor air you breathe.  Control of Dog or Cat Allergen Avoidance is the best way to manage a dog or cat allergy. If you have a dog or cat and are allergic to dog or cats, consider removing the dog or cat from the home. If you have a dog or cat but don't want to find it a new home, or if your family wants a pet even though someone in the household is allergic, here are some strategies that may help keep symptoms at bay:  Keep the pet out of your bedroom and restrict it to only a few rooms. Be advised that keeping the dog or cat in only one room will not limit the allergens to that room. Don't pet, hug or kiss the dog or cat; if you do, wash your hands with soap and water. High-efficiency particulate air (HEPA) cleaners run continuously in a bedroom or living room can reduce allergen levels over time. Regular use of a high-efficiency vacuum cleaner or a central vacuum can reduce allergen levels. Giving your dog or cat a bath at least once a week can reduce airborne allergen.  Control of Mold Allergen Mold and fungi can grow on a variety of surfaces provided certain temperature and moisture conditions exist.  Outdoor  molds grow on plants, decaying vegetation and soil.  The major outdoor mold, Alternaria and Cladosporium, are found in very high numbers during hot and dry conditions.  Generally, a late Summer - Fall peak is seen for common outdoor fungal spores.  Rain will temporarily lower outdoor mold spore count, but counts rise rapidly when the rainy period ends.  The most important indoor molds are Aspergillus and Penicillium.  Dark, humid and poorly ventilated basements are ideal sites for mold growth.  The next most common sites of mold  growth are the bathroom and the kitchen.  Outdoor [employer] Use air conditioning and keep windows closed Avoid exposure to decaying vegetation. Avoid leaf raking. Avoid grain handling. Consider wearing a face mask if working in moldy areas.  Indoor Mold Control Maintain humidity below 50%. Clean washable surfaces with 5% bleach solution. Remove sources e.g. Contaminated carpets.   Control of Dust Mite Allergen Dust mites play a major role in allergic asthma and rhinitis. They occur in environments with high humidity wherever human skin is found. Dust mites absorb humidity from the atmosphere (ie, they do not drink) and feed on organic matter (including shed human and animal skin). Dust mites are a microscopic type of insect that you cannot see with the naked eye. High levels of dust mites have been detected from mattresses, pillows, carpets, upholstered furniture, bed covers, clothes, soft toys and any woven material. The principal allergen of the dust mite is found in its feces. A gram of dust may contain 1,000 mites and 250,000 fecal particles. Mite antigen is easily measured in the air during house cleaning activities. Dust mites do not bite and do not cause harm to humans, other than by triggering allergies/asthma.  Ways to decrease your exposure to dust mites in your home:  1. Encase mattresses, box springs and pillows with a mite-impermeable barrier or cover  2. Wash sheets, blankets and drapes weekly in hot water (130 F) with detergent and dry them in a dryer on the hot setting.  3. Have the room cleaned frequently with a vacuum cleaner and a damp dust-mop. For carpeting or rugs, vacuuming with a vacuum cleaner equipped with a high-efficiency particulate air (HEPA) filter. The dust mite allergic individual should not be in a room which is being cleaned and should wait 1 hour after cleaning before going into the room.  4. Do not sleep on upholstered furniture (eg, couches).  5.  If possible removing carpeting, upholstered furniture and drapery from the home is ideal. Horizontal blinds should be eliminated in the rooms where the person spends the most time (bedroom, study, television room). Washable vinyl, roller-type shades are optimal.  6. Remove all non-washable stuffed toys from the bedroom. Wash stuffed toys weekly like sheets and blankets above.  7. Reduce indoor humidity to less than 50%. Inexpensive humidity monitors can be purchased at most hardware stores. Do not use a humidifier as can make the problem worse and are not recommended.

## 2023-02-17 ENCOUNTER — Telehealth: Payer: Self-pay | Admitting: Internal Medicine

## 2023-02-17 NOTE — Telephone Encounter (Signed)
Lm for pt to call us back about this °

## 2023-02-17 NOTE — Telephone Encounter (Signed)
Spoke with pharmacist when she called she just needed to know which inhaler to be on symbicort or airsupra verified its symbicort as of last visit Paulene Floor is prn

## 2023-02-17 NOTE — Telephone Encounter (Signed)
Patient called and stated her pharmacy told her that they need to verify with her provider that they can give her both inhalers and requested a call back.

## 2023-02-24 ENCOUNTER — Ambulatory Visit (INDEPENDENT_AMBULATORY_CARE_PROVIDER_SITE_OTHER): Payer: BC Managed Care – PPO

## 2023-02-24 DIAGNOSIS — J309 Allergic rhinitis, unspecified: Secondary | ICD-10-CM

## 2023-03-25 ENCOUNTER — Ambulatory Visit (INDEPENDENT_AMBULATORY_CARE_PROVIDER_SITE_OTHER): Payer: BC Managed Care – PPO

## 2023-03-25 DIAGNOSIS — J309 Allergic rhinitis, unspecified: Secondary | ICD-10-CM

## 2023-04-28 ENCOUNTER — Ambulatory Visit (INDEPENDENT_AMBULATORY_CARE_PROVIDER_SITE_OTHER): Payer: BC Managed Care – PPO

## 2023-04-28 DIAGNOSIS — J309 Allergic rhinitis, unspecified: Secondary | ICD-10-CM

## 2023-05-29 ENCOUNTER — Ambulatory Visit (INDEPENDENT_AMBULATORY_CARE_PROVIDER_SITE_OTHER): Payer: BC Managed Care – PPO | Admitting: *Deleted

## 2023-05-29 ENCOUNTER — Other Ambulatory Visit: Payer: Self-pay | Admitting: *Deleted

## 2023-05-29 DIAGNOSIS — J309 Allergic rhinitis, unspecified: Secondary | ICD-10-CM

## 2023-06-30 ENCOUNTER — Ambulatory Visit (INDEPENDENT_AMBULATORY_CARE_PROVIDER_SITE_OTHER): Payer: BC Managed Care – PPO

## 2023-06-30 DIAGNOSIS — J309 Allergic rhinitis, unspecified: Secondary | ICD-10-CM

## 2023-08-13 ENCOUNTER — Ambulatory Visit (INDEPENDENT_AMBULATORY_CARE_PROVIDER_SITE_OTHER): Payer: BC Managed Care – PPO

## 2023-08-13 DIAGNOSIS — J309 Allergic rhinitis, unspecified: Secondary | ICD-10-CM | POA: Diagnosis not present

## 2023-09-15 ENCOUNTER — Ambulatory Visit (INDEPENDENT_AMBULATORY_CARE_PROVIDER_SITE_OTHER): Payer: BC Managed Care – PPO

## 2023-09-15 DIAGNOSIS — J309 Allergic rhinitis, unspecified: Secondary | ICD-10-CM | POA: Diagnosis not present

## 2023-10-15 ENCOUNTER — Ambulatory Visit (INDEPENDENT_AMBULATORY_CARE_PROVIDER_SITE_OTHER): Payer: Self-pay

## 2023-10-15 DIAGNOSIS — J309 Allergic rhinitis, unspecified: Secondary | ICD-10-CM

## 2023-11-13 ENCOUNTER — Ambulatory Visit (INDEPENDENT_AMBULATORY_CARE_PROVIDER_SITE_OTHER): Payer: Self-pay

## 2023-11-13 DIAGNOSIS — J309 Allergic rhinitis, unspecified: Secondary | ICD-10-CM

## 2023-12-21 ENCOUNTER — Ambulatory Visit (INDEPENDENT_AMBULATORY_CARE_PROVIDER_SITE_OTHER)

## 2023-12-21 DIAGNOSIS — J309 Allergic rhinitis, unspecified: Secondary | ICD-10-CM

## 2023-12-28 ENCOUNTER — Ambulatory Visit (INDEPENDENT_AMBULATORY_CARE_PROVIDER_SITE_OTHER): Payer: Self-pay

## 2023-12-28 DIAGNOSIS — J309 Allergic rhinitis, unspecified: Secondary | ICD-10-CM

## 2024-01-04 ENCOUNTER — Ambulatory Visit (INDEPENDENT_AMBULATORY_CARE_PROVIDER_SITE_OTHER): Payer: Self-pay

## 2024-01-04 DIAGNOSIS — J309 Allergic rhinitis, unspecified: Secondary | ICD-10-CM

## 2024-01-21 ENCOUNTER — Ambulatory Visit (INDEPENDENT_AMBULATORY_CARE_PROVIDER_SITE_OTHER)

## 2024-01-21 DIAGNOSIS — J309 Allergic rhinitis, unspecified: Secondary | ICD-10-CM

## 2024-02-15 ENCOUNTER — Ambulatory Visit

## 2024-02-16 ENCOUNTER — Ambulatory Visit

## 2024-02-17 ENCOUNTER — Ambulatory Visit

## 2024-02-17 NOTE — Progress Notes (Signed)
 Immunotherapy   Patient Details  Name: Melanie Rangel MRN: [id number] Date of Birth: June 14, 1981  02/17/2024  Geni Tolan's vials were mailed back to [facility]  [address] [place] [address]     [name] 02/17/2024, 9:44 AM

## 2024-03-03 DIAGNOSIS — J309 Allergic rhinitis, unspecified: Secondary | ICD-10-CM
